# Patient Record
Sex: Male | Born: 1970 | Race: Black or African American | Hispanic: No | Marital: Single | Smoking: Never smoker
Health system: Southern US, Community
[De-identification: ages and names within clinical notes are randomized; demographics above are authoritative.]

## PROBLEM LIST (undated history)

## (undated) DIAGNOSIS — R51 Headache: Secondary | ICD-10-CM

## (undated) DIAGNOSIS — F329 Major depressive disorder, single episode, unspecified: Secondary | ICD-10-CM

## (undated) DIAGNOSIS — F32A Depression, unspecified: Secondary | ICD-10-CM

## (undated) DIAGNOSIS — R519 Headache, unspecified: Secondary | ICD-10-CM

## (undated) DIAGNOSIS — B009 Herpesviral infection, unspecified: Secondary | ICD-10-CM

## (undated) DIAGNOSIS — F419 Anxiety disorder, unspecified: Secondary | ICD-10-CM

## (undated) DIAGNOSIS — E119 Type 2 diabetes mellitus without complications: Secondary | ICD-10-CM

## (undated) HISTORY — PX: NO PAST SURGERIES: SHX2092

## (undated) HISTORY — DX: Headache, unspecified: R51.9

## (undated) HISTORY — DX: Headache: R51

## (undated) HISTORY — DX: Type 2 diabetes mellitus without complications: E11.9

## (undated) HISTORY — DX: Herpesviral infection, unspecified: B00.9

## (undated) HISTORY — DX: Anxiety disorder, unspecified: F41.9

---

## 2003-03-01 ENCOUNTER — Ambulatory Visit (HOSPITAL_BASED_OUTPATIENT_CLINIC_OR_DEPARTMENT_OTHER): Admission: RE | Admit: 2003-03-01 | Discharge: 2003-03-01 | Payer: Self-pay | Admitting: Plastic Surgery

## 2003-03-01 ENCOUNTER — Ambulatory Visit (HOSPITAL_COMMUNITY): Admission: RE | Admit: 2003-03-01 | Discharge: 2003-03-01 | Payer: Self-pay | Admitting: Plastic Surgery

## 2010-09-19 ENCOUNTER — Inpatient Hospital Stay (INDEPENDENT_AMBULATORY_CARE_PROVIDER_SITE_OTHER)
Admission: RE | Admit: 2010-09-19 | Discharge: 2010-09-19 | Disposition: A | Discharge status: Home / Self Care | Payer: 59 | Source: Ambulatory Visit | Attending: Emergency Medicine | Admitting: Emergency Medicine

## 2010-09-19 DIAGNOSIS — L600 Ingrowing nail: Secondary | ICD-10-CM

## 2010-09-19 DIAGNOSIS — L98499 Non-pressure chronic ulcer of skin of other sites with unspecified severity: Secondary | ICD-10-CM

## 2010-09-19 DIAGNOSIS — M79609 Pain in unspecified limb: Secondary | ICD-10-CM

## 2012-08-05 ENCOUNTER — Telehealth: Payer: Self-pay

## 2012-08-05 NOTE — Telephone Encounter (Signed)
Opened in error

## 2013-03-12 ENCOUNTER — Emergency Department (INDEPENDENT_AMBULATORY_CARE_PROVIDER_SITE_OTHER)
Admission: EM | Admit: 2013-03-12 | Discharge: 2013-03-12 | Disposition: A | Discharge status: Home / Self Care | Payer: 59 | Source: Home / Self Care | Attending: Family Medicine | Admitting: Family Medicine

## 2013-03-12 ENCOUNTER — Encounter (HOSPITAL_COMMUNITY): Payer: Self-pay | Admitting: Emergency Medicine

## 2013-03-12 DIAGNOSIS — H6690 Otitis media, unspecified, unspecified ear: Secondary | ICD-10-CM

## 2013-03-12 DIAGNOSIS — H6691 Otitis media, unspecified, right ear: Secondary | ICD-10-CM

## 2013-03-12 HISTORY — DX: Depression, unspecified: F32.A

## 2013-03-12 HISTORY — DX: Major depressive disorder, single episode, unspecified: F32.9

## 2013-03-12 MED ORDER — CIPROFLOXACIN-DEXAMETHASONE 0.3-0.1 % OT SUSP: 4.0000 [drp] | Freq: Two times a day (BID) | OTIC | Status: DC

## 2013-03-12 MED ORDER — AMOXICILLIN-POT CLAVULANATE 875-125 MG PO TABS: 1.0000 | ORAL_TABLET | Freq: Two times a day (BID) | ORAL | Status: DC

## 2013-03-12 NOTE — Discharge Instructions (Signed)
Thank you for coming in today. Use the eardrops. Take Augmentin twice daily. Take up to 2 Aleve twice daily for pain. Call or go to the emergency room if you get worse, have trouble breathing, have chest pains, or palpitations.  If not getting better [facility].

## 2013-03-12 NOTE — ED Notes (Signed)
Assessment per Dr. Corey. 

## 2013-03-12 NOTE — ED Provider Notes (Signed)
Justin BassetCedric Hafner is a 43 y.o. male who presents to Urgent Care today for ear pain. Patient has had several days of sore throat cough congestion but today developed severe right ear pain. Patient has decreased hearing as well. No nausea vomiting diarrhea fevers or chills. No medications tried. Patient feels well otherwise.   Past Medical History  Diagnosis Date  . Depression    History  Substance Use Topics  . Smoking status: Never Smoker   . Smokeless tobacco: Not on file  . Alcohol Use: Yes   ROS as above Medications reviewed. No current facility-administered medications for this encounter.   Current Outpatient Prescriptions  Medication Sig Dispense Refill  . BuPROPion HCl (WELLBUTRIN PO) Take by mouth.      Marland Kitchen. FLUoxetine HCl (PROZAC PO) Take by mouth.      . Multiple Vitamins-Minerals (MULTIVITAMIN PO) Take by mouth.      Marland Kitchen. amoxicillin-clavulanate (AUGMENTIN) 875-125 MG per tablet Take 1 tablet by mouth every 12 (twelve) hours.  14 tablet  0  . ciprofloxacin-dexamethasone (CIPRODEX) otic suspension Place 4 drops into the right ear 2 (two) times daily.  7.5 mL  0    Exam:  BP 150/86  Pulse 82  Temp(Src) 99.1 F (37.3 C) (Oral)  Resp 20  SpO2 100% Gen: Well NAD HEENT: EOMI,  MMM left panic membrane is normal. Right tympanic membrane is cloudy yellow bulging outwards with erythematous streaks. Nontender mastoids bilaterally. History pharynx is mildly erythematous. Lungs: Normal work of breathing. CTABL Heart: RRR no MRG Exts: warm and well perfused.   Assessment and Plan: 43 y.o. male with otitis media and externa. Plan to treat with Ciprodex eardrops, and Augmentin. Continue ibuprofen Tylenol or Aleve. Followup with primary care provider  Discussed warning signs or symptoms. Please see discharge instructions. Patient expresses understanding.    Justin BongEvan S Kamorie Aldous, MD 03/12/13 [phone]

## 2013-12-28 ENCOUNTER — Encounter: Payer: Self-pay | Admitting: *Deleted

## 2013-12-29 ENCOUNTER — Encounter: Payer: Self-pay | Admitting: Neurology

## 2013-12-29 ENCOUNTER — Ambulatory Visit (INDEPENDENT_AMBULATORY_CARE_PROVIDER_SITE_OTHER): Payer: 59 | Admitting: Neurology

## 2013-12-29 VITALS — BP 110/72 | HR 60 | Ht 70.0 in | Wt 235.0 lb

## 2013-12-29 DIAGNOSIS — G4486 Cervicogenic headache: Secondary | ICD-10-CM

## 2013-12-29 DIAGNOSIS — R51 Headache: Secondary | ICD-10-CM

## 2013-12-29 DIAGNOSIS — G4440 Drug-induced headache, not elsewhere classified, not intractable: Secondary | ICD-10-CM

## 2013-12-29 DIAGNOSIS — G4441 Drug-induced headache, not elsewhere classified, intractable: Secondary | ICD-10-CM

## 2013-12-29 DIAGNOSIS — E0810 Diabetes mellitus due to underlying condition with ketoacidosis without coma: Secondary | ICD-10-CM

## 2013-12-29 DIAGNOSIS — G44319 Acute post-traumatic headache, not intractable: Secondary | ICD-10-CM

## 2013-12-29 MED ORDER — TOPIRAMATE 25 MG PO TABS: 25.0000 mg | ORAL_TABLET | Freq: Every day | ORAL | Status: DC

## 2013-12-29 MED ORDER — PREDNISONE 10 MG PO TABS: ORAL_TABLET | ORAL | Status: DC

## 2013-12-29 NOTE — Patient Instructions (Addendum)
I think the headaches are due to the neck and also due to medication overuse. 1.  We will start topamax 25mg  at bedtime to reduce frequency of headaches.  Possible side effects include: impaired thinking, sedation, paresthesias (numbness and tingling) and weight loss.  It may cause dehydration and there is a small risk for kidney stones, so make sure to stay hydrated with water during the day.  There is also a very small risk for glaucoma, so if you notice any change in your vision while taking this medication, see an ophthalmologist.   2.  Start a prednisone taper to try and break the daily headaches.  Take 6tabs x1day, then 5tabs x1day, then 4tabs x1day, then 3tabs x1day, then 2tabs x1day, then 1tab x1day, then STOP.  Do not take Aleve or ibuprofen while on this. 3.  After finishing the prednisone, limit use of all pain relievers (tylenol, Aleve, ibuprofen) to no more than 2 days out of the week.   4.  To be sure, we will get MRI of the brain with and without contrast to look for other causes of daily headache. [id number]   [facility]  01/02/14 8:15am  5.  Call in 4 weeks with update and we can adjust dose of topamax.  Follow up in 3 months. 6. May continue Robaxin at bedtime
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
## 2014-01-02 ENCOUNTER — Telehealth: Payer: Self-pay | Admitting: *Deleted

## 2014-01-02 ENCOUNTER — Ambulatory Visit
Admission: RE | Admit: 2014-01-02 | Discharge: 2014-01-02 | Disposition: A | Discharge status: Home / Self Care | Payer: 59 | Source: Ambulatory Visit | Attending: Neurology | Admitting: Neurology

## 2014-01-02 DIAGNOSIS — R51 Headache: Secondary | ICD-10-CM

## 2014-01-02 DIAGNOSIS — G44319 Acute post-traumatic headache, not intractable: Secondary | ICD-10-CM

## 2014-01-02 DIAGNOSIS — G4440 Drug-induced headache, not elsewhere classified, not intractable: Secondary | ICD-10-CM

## 2014-01-02 DIAGNOSIS — G4486 Cervicogenic headache: Secondary | ICD-10-CM

## 2014-01-02 MED ORDER — GADOBENATE DIMEGLUMINE 529 MG/ML IV SOLN
20.0000 mL | Freq: Once | INTRAVENOUS | Status: AC | PRN
  Administered 2014-01-02: 20 mL via INTRAVENOUS

## 2014-01-02 NOTE — Telephone Encounter (Signed)
Message copied by Fredirick MaudlinVAN DER GLAS, Khaleah Duer E on Tue Jan 02, 2014  1:51 PM ------      Message from: JAFFE, ADAM R      Created: Tue Jan 02, 2014 11:57 AM       Brain mri is unremarkable.      ----- Message -----         From: Rad Results In Interface         Sent: 01/02/2014  10:11 AM           To: Cira ServantAdam Robert Jaffe, DO                   ------

## 2014-01-02 NOTE — Telephone Encounter (Signed)
Left message normal MRI of Brain

## 2014-02-14 ENCOUNTER — Emergency Department (HOSPITAL_COMMUNITY)
Admission: EM | Admit: 2014-02-14 | Discharge: 2014-02-14 | Disposition: A | Discharge status: Home / Self Care | Payer: 59 | Attending: Emergency Medicine | Admitting: Emergency Medicine

## 2014-02-14 ENCOUNTER — Encounter (HOSPITAL_COMMUNITY): Payer: Self-pay | Admitting: Emergency Medicine

## 2014-02-14 ENCOUNTER — Emergency Department (HOSPITAL_COMMUNITY): Payer: 59

## 2014-02-14 DIAGNOSIS — N451 Epididymitis: Secondary | ICD-10-CM | POA: Diagnosis not present

## 2014-02-14 DIAGNOSIS — E119 Type 2 diabetes mellitus without complications: Secondary | ICD-10-CM | POA: Insufficient documentation

## 2014-02-14 DIAGNOSIS — F419 Anxiety disorder, unspecified: Secondary | ICD-10-CM | POA: Diagnosis not present

## 2014-02-14 DIAGNOSIS — N508 Other specified disorders of male genital organs: Secondary | ICD-10-CM | POA: Diagnosis present

## 2014-02-14 DIAGNOSIS — Z79899 Other long term (current) drug therapy: Secondary | ICD-10-CM | POA: Diagnosis not present

## 2014-02-14 DIAGNOSIS — F329 Major depressive disorder, single episode, unspecified: Secondary | ICD-10-CM | POA: Insufficient documentation

## 2014-02-14 DIAGNOSIS — Z8619 Personal history of other infectious and parasitic diseases: Secondary | ICD-10-CM | POA: Insufficient documentation

## 2014-02-14 DIAGNOSIS — R52 Pain, unspecified: Secondary | ICD-10-CM

## 2014-02-14 MED ORDER — TRAMADOL HCL 50 MG PO TABS: 50.0000 mg | ORAL_TABLET | Freq: Four times a day (QID) | ORAL | Status: DC | PRN

## 2014-02-14 MED ORDER — CIPROFLOXACIN HCL 500 MG PO TABS
500.0000 mg | ORAL_TABLET | Freq: Once | ORAL | Status: AC
  Administered 2014-02-14: 500 mg via ORAL
  Filled 2014-02-14: qty 1

## 2014-02-14 MED ORDER — CIPROFLOXACIN HCL 500 MG PO TABS: 500.0000 mg | ORAL_TABLET | Freq: Two times a day (BID) | ORAL | Status: DC

## 2014-02-14 NOTE — ED Provider Notes (Signed)
CSN: [id number]     Arrival date & time 02/14/14  1805 History   First MD Initiated Contact with Patient 02/14/14 1816     Chief Complaint  Patient presents with  . Testicle Pain     (Consider location/radiation/quality/duration/timing/severity/associated sxs/prior Treatment) HPI  The patient has had testicular pain for several days. He reports the pain was worse yesterday. It is the left testicle. The patient was seen by his family doctor today and sent to the emergency department for ultrasound. The patient reports a testicle seemed a little swollen yesterday but not really today. He has not had pain or burning with urination. He reports a little bit of inguinal pain with it. He reports he might of had a similar pain some years back but he doesn't recall what it was at the time.  Past Medical History  Diagnosis Date  . Depression   . Headache   . Anxiety   . Diabetes mellitus without complication   . Herpes simplex    Past Surgical History  Procedure Laterality Date  . No past surgeries     Family History  Problem Relation Age of Onset  . Hypertension Father   . Hypertension Mother   . Diabetes Mother   . Heart disease Mother    History  Substance Use Topics  . Smoking status: Never Smoker   . Smokeless tobacco: Not on file  . Alcohol Use: Yes     Comment: 1-2 drinks a week    Review of Systems 10 Systems reviewed and are negative for acute change except as noted in the HPI.    Allergies  Bee venom  Home Medications   Prior to Admission medications   Medication Sig Start Date End Date Taking? Authorizing Provider  ALPRAZolam Prudy Feeler(XANAX) 0.5 MG tablet Take 0.5 mg by mouth 2 (two) times daily as needed for anxiety.   Yes Historical Provider, MD  buPROPion (WELLBUTRIN XL) 300 MG 24 hr tablet Take 300 mg by mouth daily.   Yes Historical Provider, MD  FLUoxetine (PROZAC) 20 MG capsule Take 20 mg by mouth daily.   Yes Historical Provider, MD  metFORMIN (GLUCOPHAGE) 1000  MG tablet Take 1,000 mg by mouth 2 (two) times daily with a meal.   Yes Historical Provider, MD  methocarbamol (ROBAXIN) 750 MG tablet Take 750 mg by mouth at bedtime as needed for muscle spasms.    Yes Historical Provider, MD  Multiple Vitamins-Minerals (MULTIVITAMIN PO) Take by mouth.   Yes Historical Provider, MD  valACYclovir (VALTREX) 1000 MG tablet Take 1,000 mg by mouth daily.    Yes Historical Provider, MD  ciprofloxacin (CIPRO) 500 MG tablet Take 1 tablet (500 mg total) by mouth 2 (two) times daily. 02/14/14   Arby BarretteMarcy Markevion Lattin, MD  predniSONE (DELTASONE) 10 MG tablet Take 6tabs x1day, then 5tabs x1day, then 4tabs x1day, then 3tabs x1day, then 2tabs x1day, then 1tab x1day, then STOP Patient not taking: Reported on 02/14/2014 12/29/13   Cira ServantAdam Robert Jaffe, DO  topiramate (TOPAMAX) 25 MG tablet Take 1 tablet (25 mg total) by mouth at bedtime. Patient not taking: Reported on 02/14/2014 12/29/13   Cira ServantAdam Robert Jaffe, DO  traMADol (ULTRAM) 50 MG tablet Take 1 tablet (50 mg total) by mouth every 6 (six) hours as needed. 02/14/14   Arby BarretteMarcy Natacha Jepsen, MD   BP 138/88 mmHg  Pulse 84  Temp(Src) 98 F (36.7 C) (Oral)  Resp 18  SpO2 98% Physical Exam  Constitutional: He is oriented to person, place, and time.  He appears well-developed and well-nourished. No distress.  HENT:  Head: Normocephalic and atraumatic.  Eyes: EOM are normal.  Pulmonary/Chest: Effort normal.  Abdominal: Soft. He exhibits no distension and no mass. There is no tenderness. There is no rebound and no guarding.  Genitourinary: Penis normal.  Testicular examination is normal. There is no scrotal swelling or edema. No masses in the inguinal canal. Minimal reproduced with tenderness to palpation of the left testicle. Only slight tenderness with palpation of the epididymis.  Musculoskeletal: Normal range of motion. He exhibits no edema or tenderness.  The patient's family to her about the room without any limitations.  Neurological: He is  alert and oriented to person, place, and time.  Skin: Skin is warm and dry. He is not diaphoretic.  Psychiatric: He has a normal mood and affect.    ED Course  Procedures (including critical care time) Labs Review Labs Reviewed - No data to display  Imaging Review [place] Scrotum  02/14/2014   CLINICAL DATA:  Initial encounter for two-day history of left testicular pain.  EXAM: SCROTAL ULTRASOUND  DOPPLER ULTRASOUND OF THE TESTICLES  TECHNIQUE: Complete ultrasound examination of the testicles, epididymis, and other scrotal structures was performed. Color and spectral Doppler ultrasound were also utilized to evaluate blood flow to the testicles.  COMPARISON:  None.  FINDINGS: Right testicle  Measurements: 4.3 x 2.3 x 3.1 cm. No mass or microlithiasis visualized.  Left testicle  Measurements: 4.5 x 2.0 x 3.1 cm. No mass or microlithiasis visualized.  Right epididymis:  Tiny 5 mm epididymal cyst or spermatocele.  Left epididymis:  Normal in size and appearance.  Hydrocele:  None visualized.  Varicocele:  None visualized.  Pulsed Doppler interrogation of both testes demonstrates low resistance arterial and venous waveforms bilaterally.  IMPRESSION: Tiny 5 mm right epididymal cyst or spermatocele. Otherwise normal testicular ultrasound. Specifically, no evidence for testicular torsion.   Electronically Signed   By: Kennith CenterEric  Mansell M.D.   On: 02/14/2014 19:19   [place] Art/ven Flow Abd Pelv Doppler  02/14/2014   CLINICAL DATA:  Initial encounter for two-day history of left testicular pain.  EXAM: SCROTAL ULTRASOUND  DOPPLER ULTRASOUND OF THE TESTICLES  TECHNIQUE: Complete ultrasound examination of the testicles, epididymis, and other scrotal structures was performed. Color and spectral Doppler ultrasound were also utilized to evaluate blood flow to the testicles.  COMPARISON:  None.  FINDINGS: Right testicle  Measurements: 4.3 x 2.3 x 3.1 cm. No mass or microlithiasis visualized.  Left testicle  Measurements: 4.5 x 2.0  x 3.1 cm. No mass or microlithiasis visualized.  Right epididymis:  Tiny 5 mm epididymal cyst or spermatocele.  Left epididymis:  Normal in size and appearance.  Hydrocele:  None visualized.  Varicocele:  None visualized.  Pulsed Doppler interrogation of both testes demonstrates low resistance arterial and venous waveforms bilaterally.  IMPRESSION: Tiny 5 mm right epididymal cyst or spermatocele. Otherwise normal testicular ultrasound. Specifically, no evidence for testicular torsion.   Electronically Signed   By: Kennith CenterEric  Mansell M.D.   On: 02/14/2014 19:19     EKG Interpretation None      MDM   Final diagnoses:  Epididymitis, left   ULTRASOUND HAS RULED OUT TORSION OR SIGNIFICANT PATHOLOGY. ON PALPATION THE PATIENT DOES HAVE TENDER EPIDIDYMIS. At this time based on his history and pain he will be treated appear clear for epididymitis. He is otherwise well in appearance without severe pain at this time. Pain is apparently improving relative to yesterday.  Arby Barrette, MD 02/14/14 2023

## 2014-02-14 NOTE — Discharge Instructions (Signed)
Epididymitis °Epididymitis is a swelling (inflammation) of the epididymis. The epididymis is a cord-like structure along the back part of the testicle. Epididymitis is usually, but not always, caused by infection. This is usually a sudden problem beginning with chills, fever and pain behind the scrotum and in the testicle. There may be swelling and redness of the testicle. °DIAGNOSIS  °Physical examination will reveal a tender, swollen epididymis. Sometimes, cultures are obtained from the urine or from prostate secretions to help find out if there is an infection or if the cause is a different problem. Sometimes, blood work is performed to see if your white blood cell count is elevated and if a germ (bacterial) or viral infection is present. Using this knowledge, an appropriate medicine which kills germs (antibiotic) can be chosen by your caregiver. A viral infection causing epididymitis will most often go away (resolve) without treatment. °HOME CARE INSTRUCTIONS  °· Hot sitz baths for 20 minutes, 4 times per day, may help relieve pain. °· Only take over-the-counter or prescription medicines for pain, discomfort or fever as directed by your caregiver. °· Take all medicines, including antibiotics, as directed. Take the antibiotics for the full prescribed length of time even if you are feeling better. °· It is very important to keep all follow-up appointments. °SEEK IMMEDIATE MEDICAL CARE IF:  °· You have a fever. °· You have pain not relieved with medicines. °· You have any worsening of your problems. °· Your pain seems to come and go. °· You develop pain, redness, and swelling in the scrotum and surrounding areas. °MAKE SURE YOU:  °· Understand these instructions. °· Will watch your condition. °· Will get help right away if you are not doing well or get worse. °Document Released: 02/21/2000 Document Revised: 05/18/2011 Document Reviewed: 01/10/2009 °[employer]® Patient Information ©2015 [employer]. This information  is not intended to replace advice given to you by your health care provider. Make sure you discuss any questions you have with your health care provider. ° °

## 2014-02-14 NOTE — ED Notes (Signed)
US at bedside

## 2014-02-14 NOTE — ED Notes (Signed)
Pt being sent from [place] Kater[facility] [place] [facility] for [place] and rule out testicular torsion

## 2014-04-03 ENCOUNTER — Encounter: Payer: Self-pay | Admitting: Neurology

## 2014-04-03 ENCOUNTER — Ambulatory Visit (INDEPENDENT_AMBULATORY_CARE_PROVIDER_SITE_OTHER): Payer: 59 | Admitting: Neurology

## 2014-04-03 VITALS — BP 134/78 | HR 70 | Temp 98.8°F | Resp 16 | Ht 70.0 in | Wt 228.5 lb

## 2014-04-03 DIAGNOSIS — G44219 Episodic tension-type headache, not intractable: Secondary | ICD-10-CM
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
## 2014-04-03 NOTE — Patient Instructions (Signed)
1.  It's okay to take Advil or Aleve for the headache, but no more than 2 days out of the week 2.  Continue neck exercises 3.  We will refer you for cognitive behavioral therapy to help deal with stress.  This way, we can try to prevent headaches from starting. 4.  Follow up in 3 months.

## 2014-06-08 ENCOUNTER — Ambulatory Visit: Payer: 59 | Admitting: Neurology

## 2014-06-08 DIAGNOSIS — Z029 Encounter for administrative examinations, unspecified: Secondary | ICD-10-CM

## 2014-06-11 ENCOUNTER — Encounter: Payer: Self-pay | Admitting: Neurology

## 2016-07-28 IMAGING — US US SCROTUM
1 series · 14 of 25 positions shown · non-contrast
Comparison: None.

CLINICAL DATA: Initial encounter for two-day history of left
testicular pain.

EXAM:
SCROTAL ULTRASOUND
DOPPLER ULTRASOUND OF THE TESTICLES
TECHNIQUE: Complete ultrasound examination of the testicles, epididymis, and
other scrotal structures was performed. Color and spectral Doppler
ultrasound were also utilized to evaluate blood flow to the
testicles.

[Series 1: us scrotum · 0.07mm/px · 14 of 34 slices shown]
[im 1/34]
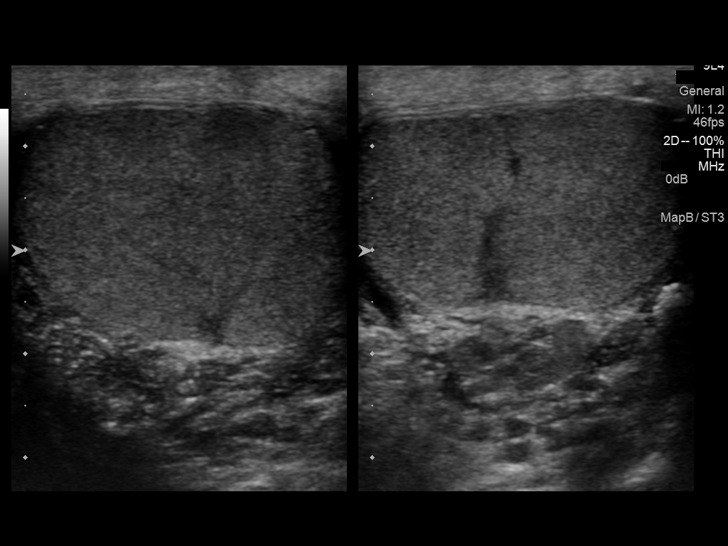
[im 3/34]
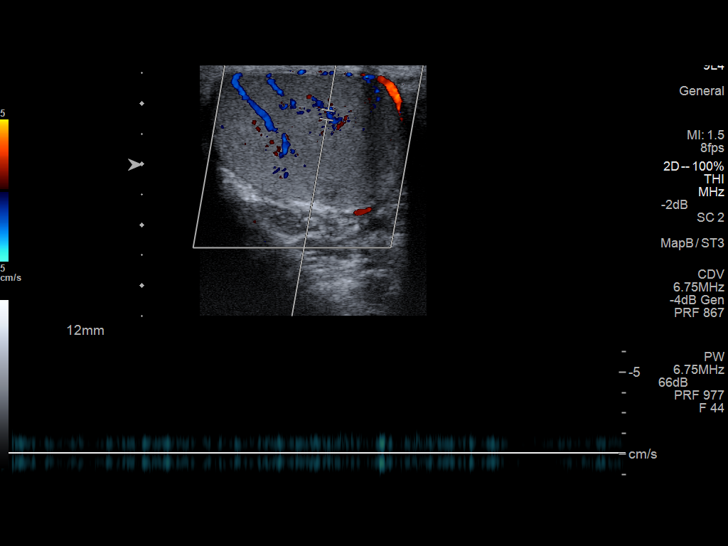
[im 6/34]
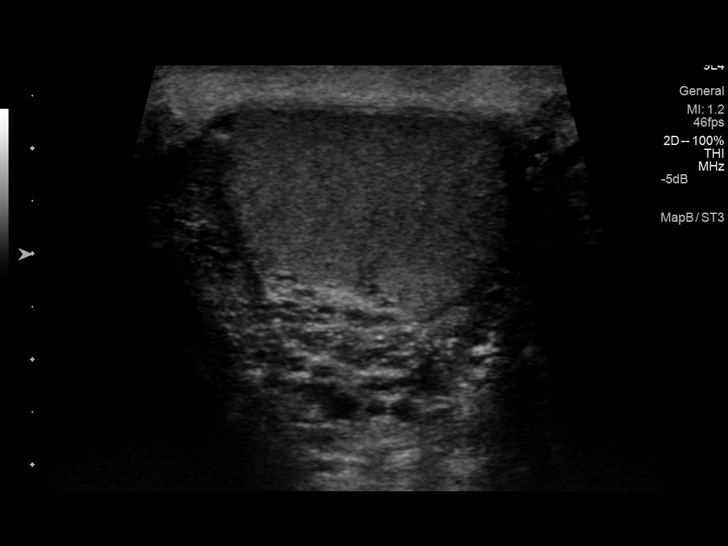
[im 9/34]
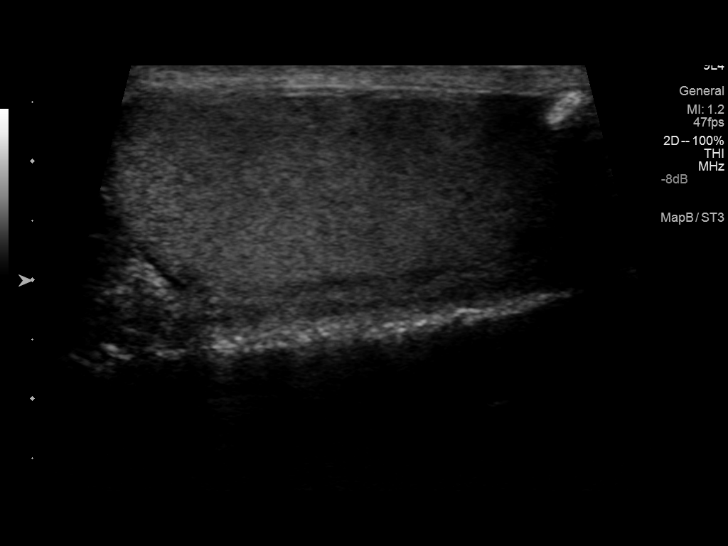
[im 12/34]
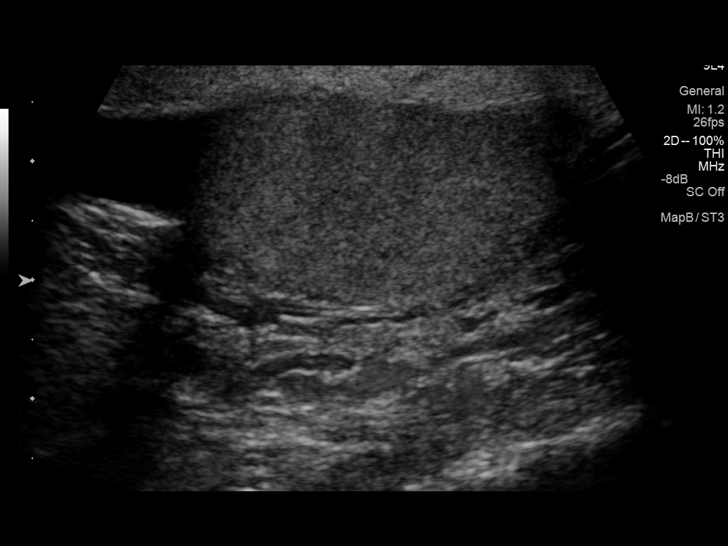
[im 13/34]
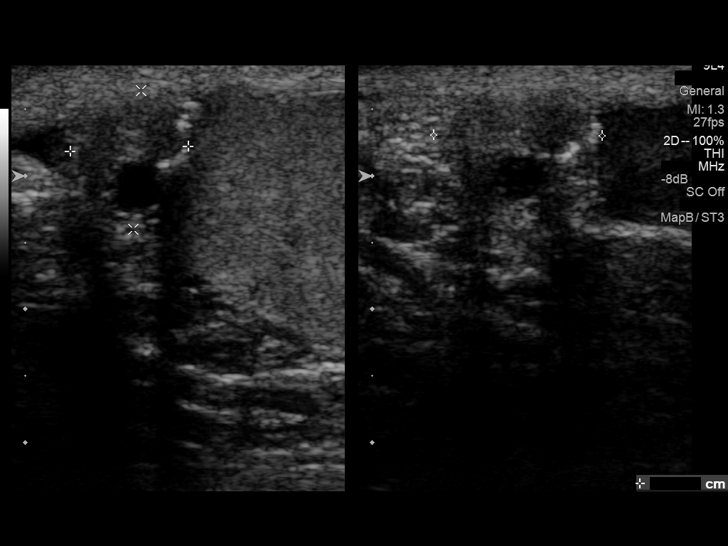
[im 16/34]
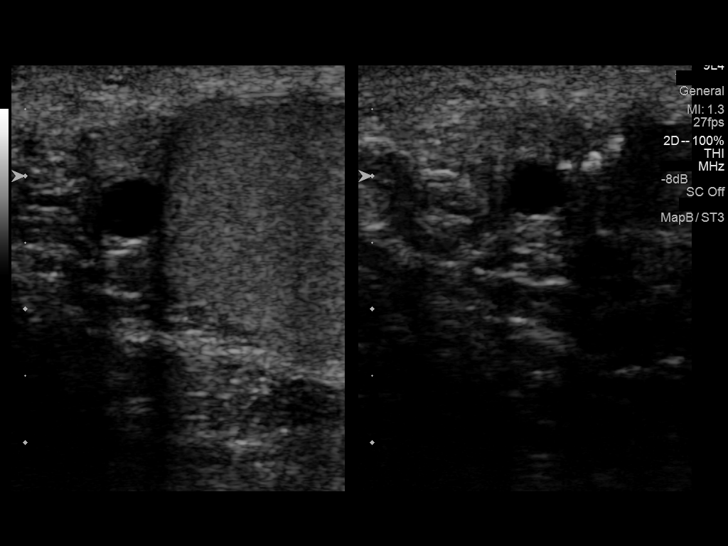
[im 18/34]
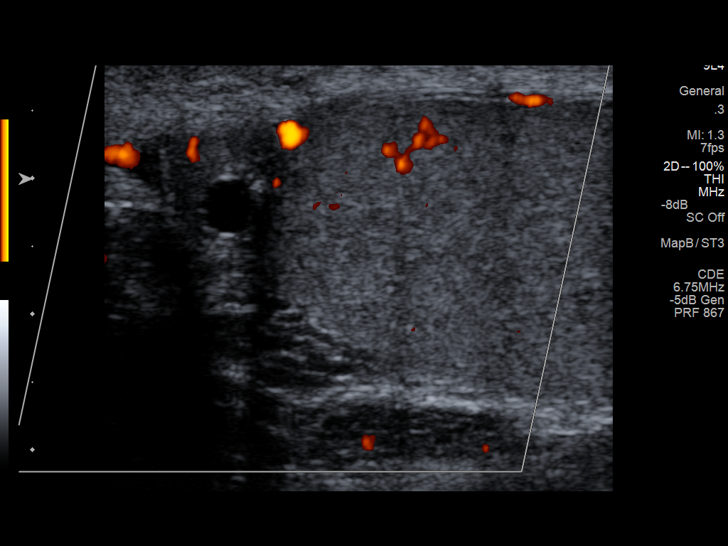
[im 21/34]
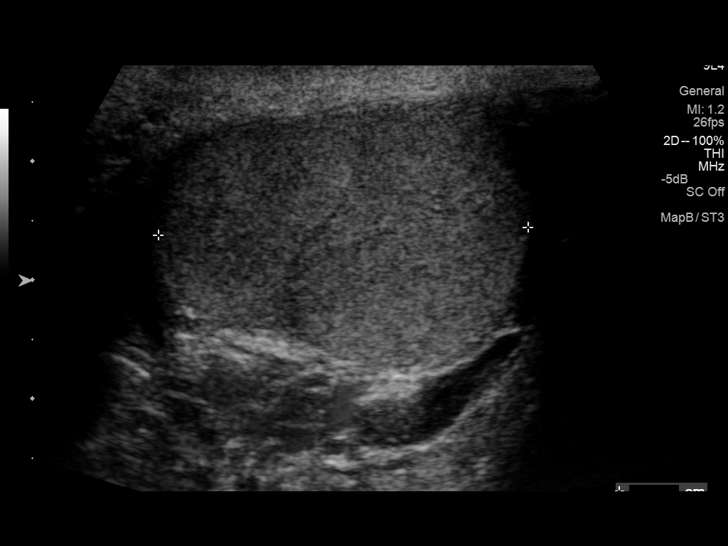
[im 23/34]
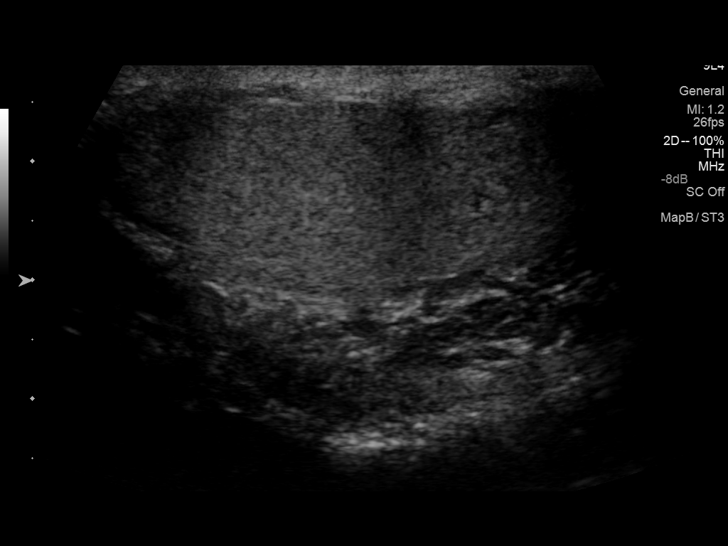
[im 25/34]
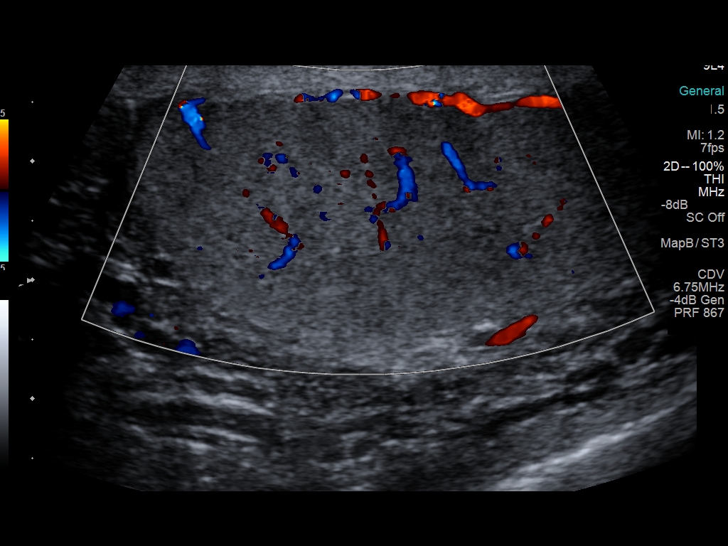
[im 28/34]
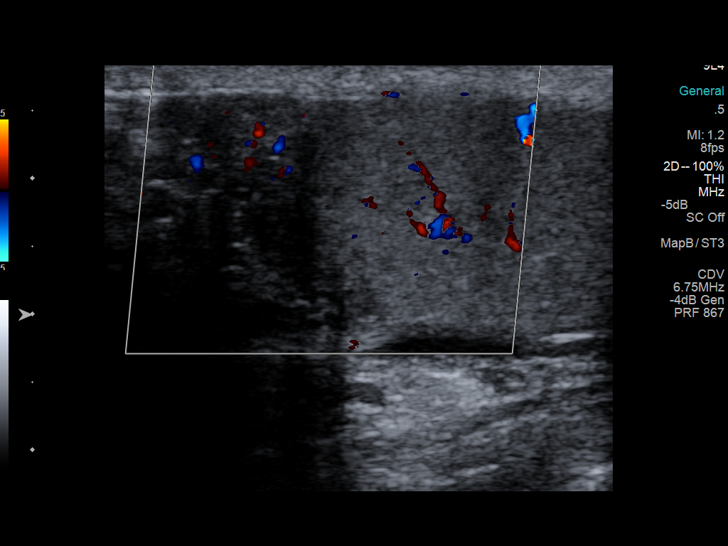
[im 31/34]
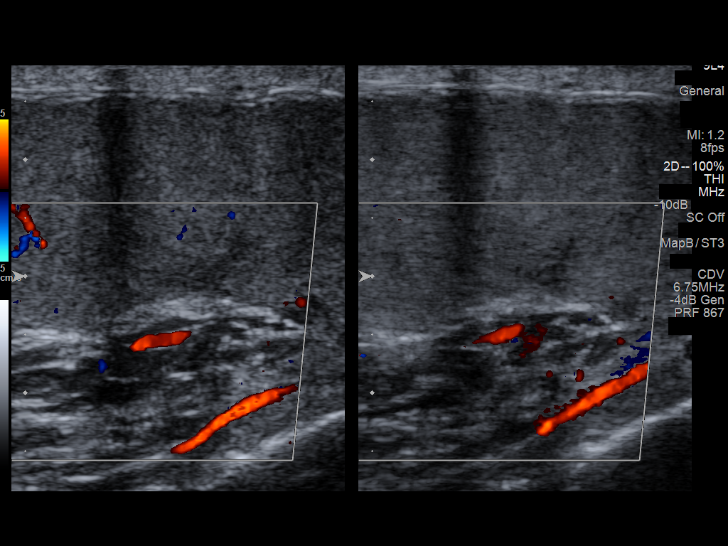
[im 34/34]
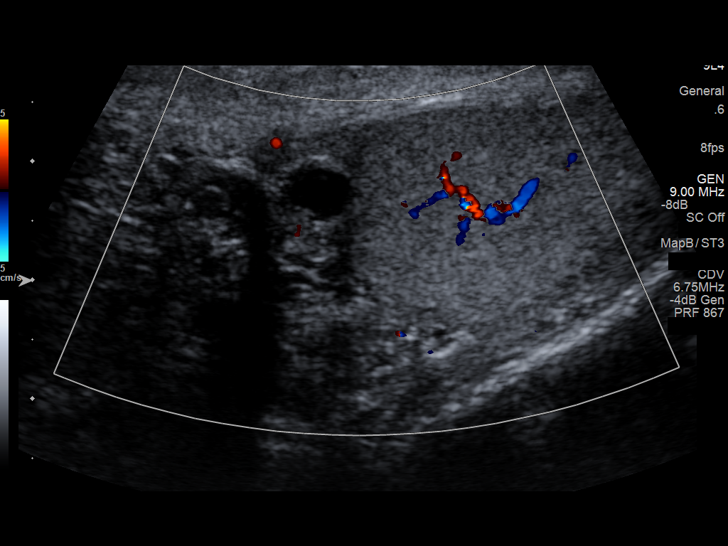

[14 of 25 positions shown; findings below may reference images not displayed]

FINDINGS: Right testicle

Measurements: 4.3 x 2.3 x 3.1 cm. No mass or microlithiasis
visualized.

Left testicle

Measurements: 4.5 x 2.0 x 3.1 cm. No mass or microlithiasis
visualized.

Right epididymis:  Tiny 5 mm epididymal cyst or spermatocele.

Left epididymis:  Normal in size and appearance.

Hydrocele:  None visualized.

Varicocele:  None visualized.

Pulsed Doppler interrogation of both testes demonstrates low
resistance arterial and venous waveforms bilaterally.
IMPRESSION: Tiny 5 mm right epididymal cyst or spermatocele. Otherwise normal
testicular ultrasound. Specifically, no evidence for testicular
torsion.

## 2017-08-26 ENCOUNTER — Encounter: Payer: Self-pay | Admitting: Cardiology

## 2017-08-27 ENCOUNTER — Ambulatory Visit: Payer: 59 | Admitting: Cardiology

## 2017-08-30 ENCOUNTER — Ambulatory Visit (INDEPENDENT_AMBULATORY_CARE_PROVIDER_SITE_OTHER): Payer: 59 | Admitting: Cardiology

## 2017-08-30 ENCOUNTER — Encounter: Payer: Self-pay | Admitting: Cardiology

## 2017-08-30 VITALS — BP 128/76 | HR 82 | Ht 70.0 in | Wt 224.0 lb

## 2017-08-30 DIAGNOSIS — E088 Diabetes mellitus due to underlying condition with unspecified complications: Secondary | ICD-10-CM

## 2017-08-30 DIAGNOSIS — R079 Chest pain, unspecified: Secondary | ICD-10-CM | POA: Diagnosis not present

## 2017-08-30 MED ORDER — ASPIRIN EC 81 MG PO TBEC: 81.0000 mg | DELAYED_RELEASE_TABLET | Freq: Every day | ORAL | 3 refills | Status: AC

## 2017-08-30 NOTE — Progress Notes (Signed)
Cardiology Office Note:    Date:  08/30/2017   ID:  Justin Cantu, DOB 1970/03/27, MRN [id number]  PCP:  Farris Has, MD  Cardiologist:  Garwin Brothers, MD   Referring MD: Farris Has, MD    ASSESSMENT:    1. Chest pain, unspecified type   2. Diabetes mellitus due to underlying condition with complication, without long-term current use of insulin (HCC)    PLAN:    In order of problems listed above:  1. Primary prevention stressed with the patient.  Importance of compliance with diet and medication stressed and he vocalized understanding. 2. His blood pressure is stable.  Diet was discussed for diabetes mellitus and the fact that he is overweight and weight reduction was stressed. 3. In view of his diagnosis of diabetes I asked him to take a coated aspirin on a daily basis after checking with his doctor.  I also told him to discuss with his primary care physician about ACE inhibitor or ARB and statin therapy in view of diabetes mellitus and he will do this and understand benefits and risks of these from him.  I also discussed this with the patient at length. 4. In view of his symptoms of schedule an exercise stress echo.  His symptoms are atypical for coronary etiology and I reassured him. 5. Patient will be seen in follow-up appointment in 4 months or earlier if the patient has any concerns    Medication Adjustments/Labs and Tests Ordered: Current medicines are reviewed at length with the patient today.  Concerns regarding medicines are outlined above.  Orders Placed This Encounter  Procedures  . EKG 12-Lead  . ECHOCARDIOGRAM STRESS TEST   No orders of the defined types were placed in this encounter.    History of Present Illness:    Justin Cantu is a 47 y.o. male who is being seen today for the evaluation of chest pain at the request of Farris Has, MD.  Patient has past medical history of diabetes mellitus type 2.  Over the past several weeks he complains of left arm  pain.  No orthopnea or PND.  He has some chest tightness unrelated to the symptoms.  He does push-ups on and off and these does not bring around the symptoms.  He does not do any aerobic exercise.  Sexual activity do not bring about the symptoms.  He tells me that his family history of coronary artery disease and therefore he is concerned and was sent here for evaluation.  At the time of my evaluation, the patient is alert awake oriented and in no distress.  Past Medical History:  Diagnosis Date  . Anxiety   . Depression   . Diabetes mellitus without complication (HCC)   . Headache   . Herpes simplex     Past Surgical History:  Procedure Laterality Date  . NO PAST SURGERIES      Current Medications: Current Meds  Medication Sig  . ALPRAZolam (XANAX) 0.5 MG tablet Take 0.5 mg by mouth 2 (two) times daily as needed for anxiety.  Marland Kitchen buPROPion (WELLBUTRIN XL) 300 MG 24 hr tablet Take 300 mg by mouth daily.  Marland Kitchen EPINEPHrine 0.3 mg/0.3 mL IJ SOAJ injection Inject 0.3 mg into the muscle See admin instructions.   Marland Kitchen FARXIGA 5 MG TABS tablet Take 1 tablet by mouth daily.  Marland Kitchen FLUoxetine (PROZAC) 20 MG capsule Take 20 mg by mouth daily.  Marland Kitchen glipiZIDE (GLUCOTROL XL) 10 MG 24 hr tablet Take 10 mg by mouth  daily.  . metFORMIN (GLUCOPHAGE) 1000 MG tablet Take 1,000 mg by mouth 2 (two) times daily with a meal.  . Multiple Vitamins-Minerals (MULTIVITAMIN PO) Take 1 tablet by mouth daily.   . valACYclovir (VALTREX) 1000 MG tablet Take 1,000 mg by mouth daily.      Allergies:   Bee venom   Social History   Socioeconomic History  . Marital status: Single    Spouse name: Not on file  . Number of children: Not on file  . Years of education: Not on file  . Highest education level: Not on file  Occupational History  . Not on file  Social Needs  . Financial resource strain: Not on file  . Food insecurity:    Worry: Not on file    Inability: Not on file  . Transportation needs:    Medical: Not on  file    Non-medical: Not on file  Tobacco Use  . Smoking status: Never Smoker  . Smokeless tobacco: Never Used  Substance and Sexual Activity  . Alcohol use: Yes    Alcohol/week: 0.0 oz    Comment: 1-2 drinks a week  . Drug use: No  . Sexual activity: Yes    Partners: Female  Lifestyle  . Physical activity:    Days per week: Not on file    Minutes per session: Not on file  . Stress: Not on file  Relationships  . Social connections:    Talks on phone: Not on file    Gets together: Not on file    Attends religious service: Not on file    Active member of club or organization: Not on file    Attends meetings of clubs or organizations: Not on file    Relationship status: Not on file  Other Topics Concern  . Not on file  Social History Narrative  . Not on file     Family History: The patient's family history includes Diabetes in his mother; Heart disease in his mother; Hypertension in his father and mother.  ROS:   Please see the history of present illness.    All other systems reviewed and are negative.  EKGs/Labs/Other Studies Reviewed:    The following studies were reviewed today: EKG reveals sinus rhythm and nonspecific ST-T changes.  I reviewed blood work from his cell phone extensively.  His lipids especially LDL is excellent.   Recent Labs: No results found for requested labs within last 8760 hours.  Recent Lipid Panel No results found for: CHOL, TRIG, HDL, CHOLHDL, VLDL, LDLCALC, LDLDIRECT  Physical Exam:    VS:  BP 128/76 (BP Location: Right Arm, Patient Position: Sitting, Cuff Size: Normal)   Pulse 82   Ht 5\' 10"  (1.778 m)   Wt 224 lb (101.6 kg)   SpO2 98%   BMI 32.14 kg/m     Wt Readings from Last 3 Encounters:  08/30/17 224 lb (101.6 kg)  04/03/14 228 lb 8 oz (103.6 kg)  12/29/13 235 lb (106.6 kg)     GEN: Patient is in no acute distress HEENT: Normal NECK: No JVD; No carotid bruits LYMPHATICS: No lymphadenopathy CARDIAC: S1 S2 regular, 2/6  systolic murmur at the apex. RESPIRATORY:  Clear to auscultation without rales, wheezing or rhonchi  ABDOMEN: Soft, non-tender, non-distended MUSCULOSKELETAL:  No edema; No deformity  SKIN: Warm and dry NEUROLOGIC:  Alert and oriented x 3 PSYCHIATRIC:  Normal affect    Signed, Garwin Brothersajan R Previn Jian, MD  08/30/2017 9:48 AM    [facility]

## 2017-08-30 NOTE — Patient Instructions (Signed)
Medication Instructions:  Your physician recommends that you continue on your current medications as directed. Please refer to the Current Medication list given to you today.  Aspirin samples given.  Labwork: None  Testing/Procedures: Your physician has requested that you have a stress echocardiogram. For further information please visit [email or web address]. Please follow instruction sheet as given.  Follow-Up: Your physician recommends that you schedule a follow-up appointment in: 3 months  Any Other Special Instructions Will Be Listed Below (If Applicable).     If you need a refill on your cardiac medications before your next appointment, please call your pharmacy.   [facility]  Garey HamAshley A, RN, BSN

## 2017-09-23 ENCOUNTER — Ambulatory Visit (HOSPITAL_BASED_OUTPATIENT_CLINIC_OR_DEPARTMENT_OTHER)
Admission: RE | Admit: 2017-09-23 | Discharge: 2017-09-23 | Disposition: A | Discharge status: Home / Self Care | Payer: 59 | Source: Ambulatory Visit | Attending: Cardiology | Admitting: Cardiology

## 2017-09-23 DIAGNOSIS — E119 Type 2 diabetes mellitus without complications: Secondary | ICD-10-CM | POA: Diagnosis not present

## 2017-09-23 DIAGNOSIS — R079 Chest pain, unspecified: Secondary | ICD-10-CM | POA: Diagnosis not present

## 2017-09-23 NOTE — Progress Notes (Signed)
  Echocardiogram Echocardiogram Stress Test has been performed.  Benedicto Capozzi T Korbyn Vanes 09/23/2017, 10:46 AM

## 2019-06-02 ENCOUNTER — Ambulatory Visit: Payer: BC Managed Care – PPO | Attending: Internal Medicine

## 2019-06-02 DIAGNOSIS — Z23 Encounter for immunization: Secondary | ICD-10-CM

## 2019-06-02 NOTE — Progress Notes (Signed)
   Covid-19 Vaccination Clinic  Name:  Justin Cantu    MRN: [id number] DOB: [birth year]  06/02/2019  Mr. [name] was observed post Covid-19 immunization for 15 minutes without incident. He was provided with Vaccine Information Sheet and instruction to access the V-Safe system.   Mr. [name] was instructed to call 911 with any severe reactions post vaccine: Marland Kitchen Difficulty breathing  . Swelling of face and throat  . A fast heartbeat  . A bad rash all over body  . Dizziness and weakness   Immunizations Administered    Name Date Dose VIS Date Route   Pfizer COVID-19 Vaccine 06/02/2019  9:06 AM 0.3 mL 02/17/2019 Intramuscular   Manufacturer: [employer], [employer]   Lot: [id number]   NDC: [address]-9

## 2019-06-27 ENCOUNTER — Ambulatory Visit: Payer: BC Managed Care – PPO | Attending: Internal Medicine

## 2019-06-27 DIAGNOSIS — Z23 Encounter for immunization: Secondary | ICD-10-CM

## 2019-06-27 NOTE — Progress Notes (Signed)
   Covid-19 Vaccination Clinic  Name:  Justin Cantu    MRN: [id number] DOB: [birth year]  06/27/2019  [name] was observed post Covid-19 immunization for 30 minutes based on pre-vaccination screening without incident. He was provided with Vaccine Information Sheet and instruction to access the V-Safe system.   [name] was instructed to call 911 with any severe reactions post vaccine: Marland Kitchen Difficulty breathing  . Swelling of face and throat  . A fast heartbeat  . A bad rash all over body  . Dizziness and weakness   Immunizations Administered    Name Date Dose VIS Date Route   Pfizer COVID-19 Vaccine 06/27/2019  9:06 AM 0.3 mL 05/03/2018 Intramuscular   Manufacturer: [employer], [employer]   Lot: [id number]   NDC: [address]-4

## 2022-08-29 LAB — HM DIABETES EYE EXAM

## 2022-11-13 ENCOUNTER — Ambulatory Visit (INDEPENDENT_AMBULATORY_CARE_PROVIDER_SITE_OTHER): Payer: No Typology Code available for payment source | Admitting: Family

## 2022-11-13 VITALS — BP 142/92 | HR 73 | Temp 97.3°F | Resp 14 | Ht 70.0 in | Wt 227.0 lb

## 2022-11-13 DIAGNOSIS — E088 Diabetes mellitus due to underlying condition with unspecified complications: Secondary | ICD-10-CM | POA: Diagnosis not present

## 2022-11-13 DIAGNOSIS — Z113 Encounter for screening for infections with a predominantly sexual mode of transmission: Secondary | ICD-10-CM

## 2022-11-13 DIAGNOSIS — Z1159 Encounter for screening for other viral diseases: Secondary | ICD-10-CM | POA: Diagnosis not present

## 2022-11-13 DIAGNOSIS — F411 Generalized anxiety disorder: Secondary | ICD-10-CM

## 2022-11-13 DIAGNOSIS — Z5181 Encounter for therapeutic drug level monitoring: Secondary | ICD-10-CM

## 2022-11-13 DIAGNOSIS — F321 Major depressive disorder, single episode, moderate: Secondary | ICD-10-CM

## 2022-11-13 DIAGNOSIS — Z7689 Persons encountering health services in other specified circumstances: Secondary | ICD-10-CM

## 2022-11-13 DIAGNOSIS — I10 Essential (primary) hypertension: Secondary | ICD-10-CM

## 2022-11-13 MED ORDER — GLIPIZIDE ER 10 MG PO TB24
10.0000 mg | ORAL_TABLET | Freq: Every day | ORAL | 1 refills | Status: DC
Start: 2022-11-13 — End: 2023-06-28

## 2022-11-13 MED ORDER — LOSARTAN POTASSIUM 25 MG PO TABS: 25.0000 mg | ORAL_TABLET | Freq: Every day | ORAL | 1 refills | Status: DC

## 2022-11-13 MED ORDER — BUPROPION HCL ER (XL) 300 MG PO TB24: 300.0000 mg | ORAL_TABLET | Freq: Every day | ORAL | 1 refills | Status: DC

## 2022-11-13 NOTE — Patient Instructions (Signed)

## 2022-11-13 NOTE — Progress Notes (Signed)
Provider: Richarda Blade FNP-C   Nuh Lipton, Donalee Citrin, NP  Patient Care Team: Trish Mancinelli, Donalee Citrin, NP as PCP - General (Family Medicine)  Extended Emergency Contact Information Primary Emergency Contact: Snowball,William Address: [address]          [place], [place] [address] [place] Home Phone: [phone] Relation: Father  Code Status:  Full Code  Goals of care: Advanced Directive information    11/13/2022    9:50 AM  Advanced Directives  Does Patient Have a Medical Advance Directive? No     Chief Complaint  Patient presents with   Establish Care    HPI:  Pt is a 52 y.o. male seen today establish care here at [facility] for medical management of chronic diseases.Has a medical history of type 2 DM ,Generalized anxiety disorder,major depression and Tinnitus.   Type 2 DM - does not check blood sugars at home.states does not like needles. States was seen by ophthalmologist [facility] Dr. Serafina Royals  Generalized anxiety disorder/Depression - states symptoms get worst when her daughter goes back to her mom in [place].  States has paranoia,fears and Obsessions.worries about her daughter being in school with shootings in schools.  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   States does some exercise by lifting weights once or twice per week.   States had Colonoscopy with [facility] Physician.   Drinks alcohol 4-5 per week.  Past Medical History:  Diagnosis Date   Anxiety    Depression    Diabetes mellitus without complication (HCC)    Headache    Herpes simplex    Past Surgical History:  Procedure Laterality Date   NO PAST SURGERIES      Allergies  Allergen Reactions   Bee Venom Anaphylaxis    And fire ants    Allergies as of 11/13/2022       Reactions   Bee Venom Anaphylaxis   And fire ants        Medication List        Accurate as of November 13, 2022  10:19 AM. If you have any questions, ask your nurse or doctor.          STOP taking these medications    Farxiga 5 MG Tabs tablet Generic drug: dapagliflozin propanediol Stopped by: Donalee Citrin Kourtney Montesinos       TAKE these medications    ALPRAZolam 0.5 MG tablet Commonly known as: XANAX Take 0.5 mg by mouth 2 (two) times daily as needed for anxiety.   aspirin EC 81 MG tablet Take 1 tablet (81 mg total) by mouth daily.   buPROPion 300 MG 24 hr tablet Commonly known as: WELLBUTRIN XL Take 300 mg by mouth daily.   EPINEPHrine 0.3 mg/0.3 mL Soaj injection Commonly known as: EPI-PEN Inject 0.3 mg into the muscle See admin instructions.   FLUoxetine 20 MG capsule Commonly known as: PROZAC Take 20 mg by mouth daily.   glipiZIDE 10 MG 24 hr tablet Commonly known as: GLUCOTROL XL Take 10 mg by mouth daily.   metFORMIN 1000 MG tablet Commonly known as: GLUCOPHAGE Take 1,000 mg by mouth 2 (two) times daily with a meal.   MULTIVITAMIN PO Take 1 tablet by mouth daily.   valACYclovir 1000 MG tablet Commonly known as: VALTREX Take 1,000 mg by mouth daily.        Review of  Systems  Constitutional:  Negative for appetite change, chills, fatigue, fever and unexpected weight change.  HENT:  Negative for congestion, dental problem, ear discharge, ear pain, facial swelling, hearing loss, nosebleeds, postnasal drip, rhinorrhea, sinus pressure, sinus pain, sneezing, sore throat, tinnitus and trouble swallowing.   Eyes:  Negative for pain, discharge, redness, itching and visual disturbance.  Respiratory:  Negative for cough, chest tightness, shortness of breath and wheezing.   Cardiovascular:  Negative for chest pain, palpitations and leg swelling.  Gastrointestinal:  Negative for abdominal distention, abdominal pain, blood in stool, constipation, diarrhea, nausea and vomiting.  Endocrine: Negative for cold intolerance, heat intolerance, polydipsia, polyphagia and polyuria.   Genitourinary:  Negative for difficulty urinating, dysuria, flank pain, frequency and urgency.  Musculoskeletal:  Negative for arthralgias, back pain, gait problem, joint swelling, myalgias, neck pain and neck stiffness.  Skin:  Negative for color change, pallor, rash and wound.  Neurological:  Negative for dizziness, syncope, speech difficulty, weakness, light-headedness, numbness and headaches.  Hematological:  Does not bruise/bleed easily.  Psychiatric/Behavioral:  Positive for sleep disturbance. Negative for agitation, behavioral problems, confusion, hallucinations, self-injury and suicidal ideas. The patient is nervous/anxious.        Fearful and paranoid  Sleeps 5-7 hrs sometimes does not sleep well due to Tinnitus     Immunization History  Administered Date(s) Administered   PFIZER(Purple Top)SARS-COV-2 Vaccination 06/02/2019, 06/27/2019   Pertinent  Health Maintenance Due  Topic Date Due   HEMOGLOBIN A1C  Never done   FOOT EXAM  Never done   OPHTHALMOLOGY EXAM  Never done   Colonoscopy  Never done   INFLUENZA VACCINE  Never done      11/13/2022    9:53 AM  Fall Risk  Falls in the past year? 0   Functional Status Survey:    Vitals:   11/13/22 0957  BP: (!) 142/92  Pulse: 73  Resp: 14  Temp: (!) 97.3 F (36.3 C)  SpO2: 98%  Weight: 227 lb (103 kg)  Height: 5\' 10"  (1.778 m)   Body mass index is 32.57 kg/m. Physical Exam Vitals reviewed.  Constitutional:      General: He is not in acute distress.    Appearance: Normal appearance. He is normal weight. He is not ill-appearing or diaphoretic.  HENT:     Head: Normocephalic.     Right Ear: Tympanic membrane, ear canal and external ear normal. There is no impacted cerumen.     Left Ear: Tympanic membrane, ear canal and external ear normal. There is no impacted cerumen.     Nose: Nose normal. No congestion or rhinorrhea.     Mouth/Throat:     Mouth: Mucous membranes are moist.     Pharynx: Oropharynx is clear. No  oropharyngeal exudate or posterior oropharyngeal erythema.  Eyes:     General: No scleral icterus.       Right eye: No discharge.        Left eye: No discharge.     Extraocular Movements: Extraocular movements intact.     Conjunctiva/sclera: Conjunctivae normal.     Pupils: Pupils are equal, round, and reactive to light.  Neck:     Vascular: No carotid bruit.  Cardiovascular:     Rate and Rhythm: Normal rate and regular rhythm.     Pulses: Normal pulses.     Heart sounds: Normal heart sounds. No murmur heard.    No friction rub. No gallop.  Pulmonary:     Effort: Pulmonary effort is normal.  No respiratory distress.     Breath sounds: Normal breath sounds. No wheezing, rhonchi or rales.  Chest:     Chest wall: No tenderness.  Abdominal:     General: Bowel sounds are normal. There is no distension.     Palpations: Abdomen is soft. There is no mass.     Tenderness: There is no abdominal tenderness. There is no right CVA tenderness, left CVA tenderness, guarding or rebound.  Musculoskeletal:        General: No swelling or tenderness. Normal range of motion.     Cervical back: Normal range of motion. No rigidity or tenderness.     Right lower leg: No edema.     Left lower leg: No edema.  Lymphadenopathy:     Cervical: No cervical adenopathy.  Skin:    General: Skin is warm and dry.     Coloration: Skin is not pale.     Findings: No bruising, erythema, lesion or rash.  Neurological:     Mental Status: He is alert and oriented to person, place, and time.     Cranial Nerves: No cranial nerve deficit.     Sensory: No sensory deficit.     Motor: No weakness.     Coordination: Coordination normal.     Gait: Gait normal.  Psychiatric:        Mood and Affect: Mood is anxious.        Speech: Speech normal.        Behavior: Behavior normal.        Thought Content: Thought content is paranoid.        Judgment: Judgment normal.     Labs reviewed: No results for input(s): "NA", "K",  "CL", "CO2", "GLUCOSE", "BUN", "CREATININE", "CALCIUM", "MG", "PHOS" in the last 8760 hours. No results for input(s): "AST", "ALT", "ALKPHOS", "BILITOT", "PROT", "ALBUMIN" in the last 8760 hours. No results for input(s): "WBC", "NEUTROABS", "HGB", "HCT", "MCV", "PLT" in the last 8760 hours. No results found for: "TSH" No results found for: "HGBA1C" No results found for: "CHOL", "HDL", "LDLCALC", "LDLDIRECT", "TRIG", "CHOLHDL"  Significant Diagnostic Results in last 30 days:  No results found.  Assessment/Plan 1. Diabetes mellitus due to underlying condition with unspecified complications (HCC) No Recent A1C or home CBG for review - Microalbumin / creatinine urine ratio - Lipid panel - TSH - COMPLETE METABOLIC PANEL WITH GFR - CBC with Differential/Platelet - glipiZIDE (GLUCOTROL XL) 10 MG 24 hr tablet; Take 1 tablet (10 mg total) by mouth daily.  Dispense: 90 tablet; Refill: 1  2. Generalized anxiety disorder - continue on Fluoxetine and Wellbutrin  - TSH  3. Current moderate episode of major depressive disorder, unspecified whether recurrent (HCC) Continue on Fluoxetine and Wellbutrin  - TSH - buPROPion (WELLBUTRIN XL) 300 MG 24 hr tablet; Take 1 tablet (300 mg total) by mouth daily.  Dispense: 90 tablet; Refill: 1 4. Encounter for hepatitis C screening test for low risk patient Low risk  - Hepatitis C antibody  5. Screen for STD (sexually transmitted disease) No high risk behaviors reported  - HIV Antibody (routine testing w rflx)  6. Primary hypertension B/p not controlled off medication  - start on Losartan as below  - dietary modification and exercise at least three times per week for 30 minutes - TSH - COMPLETE METABOLIC PANEL WITH GFR - CBC with Differential/Platelet - losartan (COZAAR) 25 MG tablet; Take 1 tablet (25 mg total) by mouth daily.  Dispense: 90 tablet; Refill: 1  7.  Encounter to establish care Immunization due for Tdap and COVID 19 vaccine advised to  bring immunization record and will update.  Advised to schedule for fasting blood work.  8. Therapeutic drug monitoring On alprazolam  PDMP  - DRUG MONITORING, PANEL 6 WITH CONFIRMATION, URINE  Family/ staff Communication: Reviewed plan of care with patient verbalized understanding  Labs/tests ordered:  - TSH - COMPLETE METABOLIC PANEL WITH GFR - CBC with Differential/Platelet - DRUG MONITORING, PANEL 6 WITH CONFIRMATION, URINE  Next Appointment : Return in about 2 weeks (around 11/27/2022) for Blood pressure.   Caesar Bookman, NP

## 2022-11-14 LAB — COMPLETE METABOLIC PANEL WITH GFR
AG Ratio: 1.3 (calc) (ref 1.0–2.5)
ALT: 37 U/L (ref 9–46)
AST: 17 U/L (ref 10–35)
Albumin: 4.4 g/dL (ref 3.6–5.1)
Alkaline phosphatase (APISO): 63 U/L (ref 35–144)
BUN: 7 mg/dL (ref 7–25)
CO2: 27 mmol/L (ref 20–32)
Calcium: 9.2 mg/dL (ref 8.6–10.3)
Chloride: 98 mmol/L (ref 98–110)
Creat: 0.82 mg/dL (ref 0.70–1.30)
Globulin: 3.5 g/dL (ref 1.9–3.7)
Glucose, Bld: 294 mg/dL — ABNORMAL HIGH (ref 65–99)
Potassium: 4.1 mmol/L (ref 3.5–5.3)
Sodium: 134 mmol/L — ABNORMAL LOW (ref 135–146)
Total Bilirubin: 0.4 mg/dL (ref 0.2–1.2)
Total Protein: 7.9 g/dL (ref 6.1–8.1)
eGFR: 107 mL/min/{1.73_m2} (ref >=60)

## 2022-11-14 LAB — DRUG MONITORING, PANEL 6 WITH CONFIRMATION, URINE
6 Acetylmorphine: NEGATIVE ng/mL (ref <10)
Alcohol Metabolites: NEGATIVE ng/mL (ref <500)
Amphetamines: NEGATIVE ng/mL (ref <500)
Barbiturates: NEGATIVE ng/mL (ref <300)
Benzodiazepines: NEGATIVE ng/mL (ref <100)
Cocaine Metabolite: NEGATIVE ng/mL (ref <150)
Creatinine: 96 mg/dL (ref >=20.0)
Marijuana Metabolite: NEGATIVE ng/mL (ref <20)
Methadone Metabolite: NEGATIVE ng/mL (ref <100)
Opiates: NEGATIVE ng/mL (ref <100)
Oxidant: NEGATIVE ug/mL (ref <200)
Oxycodone: NEGATIVE ng/mL (ref <100)
Phencyclidine: NEGATIVE ng/mL (ref <25)
pH: 5.2 (ref 4.5–9.0)

## 2022-11-14 LAB — TSH: TSH: 1.63 m[IU]/L (ref 0.40–4.50)

## 2022-11-14 LAB — CBC WITH DIFFERENTIAL/PLATELET
Absolute Monocytes: 467 {cells}/uL (ref 200–950)
Basophils Absolute: 70 cells/uL (ref 0–200)
Basophils Relative: 1.1 %
Eosinophils Absolute: 211 {cells}/uL (ref 15–500)
Eosinophils Relative: 3.3 %
HCT: 46.7 % (ref 38.5–50.0)
Hemoglobin: 14.8 g/dL (ref 13.2–17.1)
Lymphs Abs: 1850 {cells}/uL (ref 850–3900)
MCH: 26.3 pg — ABNORMAL LOW (ref 27.0–33.0)
MCHC: 31.7 g/dL — ABNORMAL LOW (ref 32.0–36.0)
MCV: 82.9 fL (ref 80.0–100.0)
MPV: 12.5 fL (ref 7.5–12.5)
Monocytes Relative: 7.3 %
Neutro Abs: 3802 {cells}/uL (ref 1500–7800)
Neutrophils Relative %: 59.4 %
Platelets: 148 10*3/uL (ref 140–400)
RBC: 5.63 10*6/uL (ref 4.20–5.80)
RDW: 11.9 % (ref 11.0–15.0)
Total Lymphocyte: 28.9 %
WBC: 6.4 10*3/uL (ref 3.8–10.8)

## 2022-11-14 LAB — LIPID PANEL
Cholesterol: 140 mg/dL (ref <200)
HDL: 69 mg/dL (ref >=40)
LDL Cholesterol (Calc): 55 mg/dL
Non-HDL Cholesterol (Calc): 71 mg/dL (ref <130)
Total CHOL/HDL Ratio: 2 (calc) (ref <5.0)
Triglycerides: 80 mg/dL (ref <150)

## 2022-11-14 LAB — DM TEMPLATE

## 2022-11-14 LAB — HEPATITIS C ANTIBODY: Hepatitis C Ab: NONREACTIVE

## 2022-11-14 LAB — MICROALBUMIN / CREATININE URINE RATIO
Creatinine, Urine: 108 mg/dL (ref 20–320)
Microalb Creat Ratio: 17 mg/g{creat} (ref <30)
Microalb, Ur: 1.8 mg/dL

## 2022-11-14 LAB — HIV ANTIBODY (ROUTINE TESTING W REFLEX): HIV 1&2 Ab, 4th Generation: NONREACTIVE

## 2022-12-02 ENCOUNTER — Ambulatory Visit: Payer: No Typology Code available for payment source | Admitting: Family

## 2022-12-02 ENCOUNTER — Encounter: Payer: Self-pay | Admitting: Family

## 2022-12-02 VITALS — BP 150/98 | HR 81 | Temp 98.1°F | Resp 16 | Ht 70.0 in | Wt 227.0 lb

## 2022-12-02 DIAGNOSIS — I10 Essential (primary) hypertension: Secondary | ICD-10-CM

## 2022-12-02 DIAGNOSIS — E088 Diabetes mellitus due to underlying condition with unspecified complications: Secondary | ICD-10-CM

## 2022-12-02 DIAGNOSIS — F411 Generalized anxiety disorder: Secondary | ICD-10-CM | POA: Diagnosis not present

## 2022-12-02 DIAGNOSIS — Z23 Encounter for immunization: Secondary | ICD-10-CM | POA: Diagnosis not present

## 2022-12-02 MED ORDER — LOSARTAN POTASSIUM 50 MG PO TABS
50.0000 mg | ORAL_TABLET | Freq: Every day | ORAL | 1 refills | Status: DC
Start: 2022-12-02 — End: 2023-01-13

## 2022-12-02 MED ORDER — ALPRAZOLAM 0.5 MG PO TABS: 0.5000 mg | ORAL_TABLET | Freq: Two times a day (BID) | ORAL | 3 refills | Status: DC | PRN

## 2022-12-02 MED ORDER — EPINEPHRINE 0.3 MG/0.3ML IJ SOAJ: 0.3000 mg | INTRAMUSCULAR | 0 refills | Status: DC

## 2022-12-02 MED ORDER — SEMAGLUTIDE 3 MG PO TABS: 3.0000 mg | ORAL_TABLET | Freq: Every day | ORAL | 0 refills | Status: DC

## 2022-12-02 MED ORDER — BLOOD GLUCOSE MONITORING SUPPL DEVI
1.0000 | Freq: Three times a day (TID) | 0 refills | Status: DC
Start: 2022-12-02 — End: 2023-10-19

## 2022-12-02 NOTE — Progress Notes (Signed)
Provider: Richarda Blade FNP-C  Jatasia Gundrum, Donalee Citrin, NP  Patient Care Team: Annaston Upham, Donalee Citrin, NP as PCP - General (Family Medicine)  Extended Emergency Contact Information Primary Emergency Contact: Melito,William Address: [address]          [place], [place] [address] [place] Home Phone: [phone] Relation: Father  Code Status:  Full Code  Goals of care: Advanced Directive information    12/02/2022    1:26 PM  Advanced Directives  Does Patient Have a Medical Advance Directive? No     Chief Complaint  Patient presents with   Follow-up    BP Check    HPI:  Pt is a 52 y.o. male seen today for an acute visit for follow up high blood pressure.He was here 11/13/2022 with high B/p.He was started on Losartan 25 mg tablet daily.No home B/p for review. He denies any headache,dizziness,vision changes,fatigue,chest tightness,palpitation,chest pain or shortness of breath.     Type 2 DM - does not check blood sugar at home.Has no glucometer.States feels like CBG have been high but has adapted to it. Recent Glucose 294   Past Medical History:  Diagnosis Date   Anxiety    Depression    Diabetes mellitus without complication (HCC)    Headache    Herpes simplex    Past Surgical History:  Procedure Laterality Date   NO PAST SURGERIES      Allergies  Allergen Reactions   Bee Venom Anaphylaxis    And fire ants    Outpatient Encounter Medications as of 12/02/2022  Medication Sig   aspirin EC 81 MG tablet Take 1 tablet (81 mg total) by mouth daily.   buPROPion (WELLBUTRIN XL) 300 MG 24 hr tablet Take 1 tablet (300 mg total) by mouth daily.   FLUoxetine (PROZAC) 20 MG capsule Take 20 mg by mouth daily.   glipiZIDE (GLUCOTROL XL) 10 MG 24 hr tablet Take 1 tablet (10 mg total) by mouth daily.   losartan (COZAAR) 25 MG tablet Take 1 tablet (25 mg total) by mouth daily.   Multiple Vitamins-Minerals (MULTIVITAMIN PO) Take 1 tablet by mouth daily.     ALPRAZolam (XANAX) 0.5 MG tablet Take 0.5 mg by mouth 2 (two) times daily as needed for anxiety. (Patient not taking: Reported on 12/02/2022)   EPINEPHrine 0.3 mg/0.3 mL IJ SOAJ injection Inject 0.3 mg into the muscle See admin instructions.  (Patient not taking: Reported on 12/02/2022)   No facility-administered encounter medications on file as of 12/02/2022.    Review of Systems  Constitutional:  Negative for appetite change, chills, fatigue, fever and unexpected weight change.  HENT:  Negative for congestion, ear discharge, ear pain, hearing loss, nosebleeds, postnasal drip, rhinorrhea, sinus pressure, sinus pain, sneezing, sore throat, tinnitus and trouble swallowing.   Eyes:  Negative for pain, discharge, redness, itching and visual disturbance.  Respiratory:  Negative for cough, chest tightness, shortness of breath and wheezing.   Cardiovascular:  Negative for chest pain, palpitations and leg swelling.  Gastrointestinal:  Negative for abdominal distention, abdominal pain, nausea and vomiting.  Genitourinary:  Negative for difficulty urinating, dysuria, flank pain, frequency and urgency.  Musculoskeletal:  Negative for arthralgias, back pain, gait problem, joint swelling, myalgias, neck pain and neck stiffness.  Skin:  Negative for color change, pallor, rash and wound.  Neurological:  Negative for dizziness, syncope, speech difficulty, weakness, light-headedness, numbness and headaches.  Hematological:  Does not bruise/bleed easily.  Psychiatric/Behavioral:  Negative for agitation,  behavioral problems, confusion, hallucinations and sleep disturbance. The patient is not nervous/anxious.     Immunization History  Administered Date(s) Administered   PFIZER(Purple Top)SARS-COV-2 Vaccination 06/02/2019, 06/27/2019   Pertinent  Health Maintenance Due  Topic Date Due   HEMOGLOBIN A1C  Never done   FOOT EXAM  Never done   OPHTHALMOLOGY EXAM  Never done   Colonoscopy  Never done   INFLUENZA  VACCINE  12/08/2022 (Originally 10/08/2022)      11/13/2022    9:53 AM 12/02/2022    1:26 PM  Fall Risk  Falls in the past year? 0 0   Functional Status Survey:    Vitals:   12/02/22 1327 12/02/22 1332  BP: (!) 150/100 (!) 150/98  Pulse: 81   Resp: 16   Temp: 98.1 F (36.7 C)   SpO2: 98%   Weight: 227 lb (103 kg)   Height: 5\' 10"  (1.778 m)    Body mass index is 32.57 kg/m. Physical Exam Vitals reviewed.  Constitutional:      General: He is not in acute distress.    Appearance: Normal appearance. He is obese. He is not ill-appearing or diaphoretic.  HENT:     Head: Normocephalic.     Mouth/Throat:     Mouth: Mucous membranes are moist.     Pharynx: Oropharynx is clear. No oropharyngeal exudate or posterior oropharyngeal erythema.  Eyes:     General: No scleral icterus.       Right eye: No discharge.        Left eye: No discharge.     Conjunctiva/sclera: Conjunctivae normal.     Pupils: Pupils are equal, round, and reactive to light.  Neck:     Vascular: No carotid bruit.  Cardiovascular:     Rate and Rhythm: Normal rate and regular rhythm.     Pulses: Normal pulses.     Heart sounds: Normal heart sounds. No murmur heard.    No friction rub. No gallop.  Pulmonary:     Effort: Pulmonary effort is normal. No respiratory distress.     Breath sounds: Normal breath sounds. No wheezing, rhonchi or rales.  Chest:     Chest wall: No tenderness.  Abdominal:     General: Bowel sounds are normal. There is no distension.     Palpations: Abdomen is soft. There is no mass.     Tenderness: There is no abdominal tenderness. There is no right CVA tenderness, left CVA tenderness, guarding or rebound.  Musculoskeletal:        General: No swelling or tenderness. Normal range of motion.     Cervical back: Normal range of motion. No rigidity or tenderness.     Right lower leg: No edema.     Left lower leg: No edema.  Lymphadenopathy:     Cervical: No cervical adenopathy.  Skin:     General: Skin is warm and dry.     Coloration: Skin is not pale.     Findings: No erythema or rash.  Neurological:     Mental Status: He is alert and oriented to person, place, and time.     Motor: No weakness.     Gait: Gait normal.  Psychiatric:        Mood and Affect: Mood normal.        Speech: Speech normal.        Behavior: Behavior normal.     Labs reviewed: Recent Labs    11/13/22 1111  NA 134*  K 4.1  CL 98  CO2 27  GLUCOSE 294*  BUN 7  CREATININE 0.82  CALCIUM 9.2   Recent Labs    11/13/22 1111  AST 17  ALT 37  BILITOT 0.4  PROT 7.9   Recent Labs    11/13/22 1111  WBC 6.4  NEUTROABS 3,802  HGB 14.8  HCT 46.7  MCV 82.9  PLT 148   Lab Results  Component Value Date   TSH 1.63 11/13/2022   No results found for: "HGBA1C" Lab Results  Component Value Date   CHOL 140 11/13/2022   HDL 69 11/13/2022   LDLCALC 55 11/13/2022   TRIG 80 11/13/2022   CHOLHDL 2.0 11/13/2022    Significant Diagnostic Results in last 30 days:  No results found.  Assessment/Plan 1. Primary hypertension Blood pressure still not at goal -Increase losartan from 25 mg to 50 mg tablet daily -Dietary modification and exercise advised - losartan (COZAAR) 50 MG tablet; Take 1 tablet (50 mg total) by mouth daily.  Dispense: 90 tablet; Refill: 1  2. Diabetes mellitus due to underlying condition with unspecified complications (HCC) No A1c for review -Continue on glipizide and semaglutide -Continue on aspirin for cardiac event prevention - Hemoglobin A1c - Blood Glucose Monitoring Suppl DEVI; 1 each by Does not apply route in the morning, at noon, and at bedtime. May substitute to any manufacturer covered by patient's insurance.  Dispense: 1 each; Refill: 0  3. Generalized anxiety disorder Continue on alprazolam as needed  4. Need for influenza vaccination Afebrile Flut shot administered by CMA no acute reaction reported.  - Flu Vaccine Trivalent High Dose  (Fluad)  Family/ staff Communication: Reviewed plan of care with patient verbalized understanding  Labs/tests ordered:  - Hemoglobin A1c  Next Appointment: Return in about 1 month (around 01/01/2023) for Hypertension and Type 2 DM .   Caesar Bookman, NP

## 2022-12-03 LAB — HEMOGLOBIN A1C
Hgb A1c MFr Bld: 12 % of total Hgb — ABNORMAL HIGH (ref <5.7)
Mean Plasma Glucose: 298 mg/dL
eAG (mmol/L): 16.5 mmol/L

## 2022-12-07 DIAGNOSIS — F411 Generalized anxiety disorder: Secondary | ICD-10-CM | POA: Insufficient documentation

## 2022-12-07 DIAGNOSIS — I10 Essential (primary) hypertension: Secondary | ICD-10-CM | POA: Insufficient documentation

## 2022-12-16 ENCOUNTER — Ambulatory Visit: Payer: No Typology Code available for payment source | Admitting: Family

## 2022-12-30 ENCOUNTER — Ambulatory Visit (INDEPENDENT_AMBULATORY_CARE_PROVIDER_SITE_OTHER): Payer: No Typology Code available for payment source | Admitting: Family

## 2022-12-30 ENCOUNTER — Encounter: Payer: Self-pay | Admitting: Family

## 2022-12-30 ENCOUNTER — Other Ambulatory Visit: Payer: Self-pay | Admitting: Family

## 2022-12-30 VITALS — BP 160/100 | HR 80 | Temp 97.5°F | Resp 20 | Ht 70.0 in | Wt 226.8 lb

## 2022-12-30 DIAGNOSIS — I10 Essential (primary) hypertension: Secondary | ICD-10-CM

## 2022-12-30 DIAGNOSIS — E088 Diabetes mellitus due to underlying condition with unspecified complications: Secondary | ICD-10-CM | POA: Diagnosis not present

## 2022-12-30 DIAGNOSIS — Z1211 Encounter for screening for malignant neoplasm of colon: Secondary | ICD-10-CM

## 2022-12-30 DIAGNOSIS — L84 Corns and callosities: Secondary | ICD-10-CM | POA: Diagnosis not present

## 2022-12-30 MED ORDER — FREESTYLE LIBRE 3 SENSOR MISC: 1.0000 | 11 refills | Status: DC

## 2022-12-30 MED ORDER — AMLODIPINE BESYLATE 5 MG PO TABS: 5.0000 mg | ORAL_TABLET | Freq: Every day | ORAL | 1 refills | Status: DC

## 2022-12-30 MED ORDER — CLONIDINE HCL 0.1 MG PO TABS
0.1000 mg | ORAL_TABLET | Freq: Once | ORAL | Status: AC
  Administered 2022-12-30: 0.1 mg via ORAL

## 2022-12-30 MED ORDER — SEMAGLUTIDE 7 MG PO TABS
7.0000 mg | ORAL_TABLET | ORAL | 0 refills | Status: DC
Start: 2022-12-30 — End: 2022-12-31

## 2022-12-30 NOTE — Progress Notes (Signed)
Provider: Richarda Blade FNP-C  Obryan Radu, Donalee Citrin, NP  Patient Care Team: Danyka Merlin, Donalee Citrin, NP as PCP - General (Family Medicine) Illene Labrador, OD (Optometry)  Extended Emergency Contact Information Primary Emergency Contact: Ayo,William Address: [address]          [place], [place] [address] [place] Home Phone: [phone] Relation: Father  Code Status:  Full Code  Goals of care: Advanced Directive information    12/02/2022    1:26 PM  Advanced Directives  Does Patient Have a Medical Advance Directive? No     Chief Complaint  Patient presents with   Medical Management of Chronic Issues    One month follow up    HPI:  Pt is a 52 y.o. male seen today for follow up for high blood sugars.  His latest Hgb A1C 12.0 done 12/02/2022.  He was started on semaglutide 3 mg tablet and advised to check blood sugars at home then follow-up today for reevaluation. His blood pressure was also elevated on previous visit 150/100 recheck was 150/98 he was advised to increase losartan from 25 mg to 50 mg tablet daily then follow-up today.  Blood pressure continues to be elevated this visit.  He denies any acute issues.  He is due for colon cancer screening Cologuard versus colonoscopy discussed prefers colonoscopy.  Made aware will put referral then gastroenterologist office will call him to schedule an appointment.  Also due for zoster, Tdap and COVID-19 vaccine.  Made aware to get immunization at the pharmacy.  Past Medical History:  Diagnosis Date   Anxiety    Depression    Diabetes mellitus without complication (HCC)    Headache    Herpes simplex    Past Surgical History:  Procedure Laterality Date   NO PAST SURGERIES      Allergies  Allergen Reactions   Bee Venom Anaphylaxis    And fire ants    Outpatient Encounter Medications as of 12/30/2022  Medication Sig   ALPRAZolam (XANAX) 0.5 MG tablet Take 1 tablet (0.5 mg total) by mouth 2 (two)  times daily as needed for anxiety.   amLODipine (NORVASC) 5 MG tablet Take 1 tablet (5 mg total) by mouth daily.   aspirin EC 81 MG tablet Take 1 tablet (81 mg total) by mouth daily.   buPROPion (WELLBUTRIN XL) 300 MG 24 hr tablet Take 1 tablet (300 mg total) by mouth daily.   Continuous Glucose Sensor (FREESTYLE LIBRE 3 SENSOR) MISC 1 Device by Does not apply route every 14 (fourteen) days. Place 1 sensor on the skin every 14 days. Use to check glucose continuously   EPINEPHrine 0.3 mg/0.3 mL IJ SOAJ injection Inject 0.3 mg into the muscle See admin instructions.   FLUoxetine (PROZAC) 20 MG capsule Take 20 mg by mouth daily.   glipiZIDE (GLUCOTROL XL) 10 MG 24 hr tablet Take 1 tablet (10 mg total) by mouth daily.   losartan (COZAAR) 50 MG tablet Take 1 tablet (50 mg total) by mouth daily.   Multiple Vitamins-Minerals (MULTIVITAMIN PO) Take 1 tablet by mouth daily.    [DISCONTINUED] Semaglutide 3 MG TABS Take 1 tablet (3 mg total) by mouth daily.   [DISCONTINUED] Semaglutide 7 MG TABS Take 1 tablet (7 mg total) by mouth once a week.   Blood Glucose Monitoring Suppl DEVI 1 each by Does not apply route in the morning, at noon, and at bedtime. May substitute to any manufacturer covered by patient's insurance. (Patient not  taking: Reported on 12/30/2022)   [EXPIRED] cloNIDine (CATAPRES) tablet 0.1 mg    No facility-administered encounter medications on file as of 12/30/2022.    Review of Systems  Constitutional:  Negative for appetite change, chills, fatigue, fever and unexpected weight change.  HENT:  Negative for congestion, dental problem, ear discharge, ear pain, facial swelling, hearing loss, nosebleeds, postnasal drip, rhinorrhea, sinus pressure, sinus pain, sneezing, sore throat, tinnitus and trouble swallowing.   Eyes:  Negative for pain, discharge, redness, itching and visual disturbance.  Respiratory:  Negative for cough, chest tightness, shortness of breath and wheezing.    Cardiovascular:  Negative for chest pain, palpitations and leg swelling.  Gastrointestinal:  Negative for abdominal distention, abdominal pain, blood in stool, constipation, diarrhea, nausea and vomiting.  Endocrine: Negative for cold intolerance, heat intolerance, polydipsia, polyphagia and polyuria.  Genitourinary:  Negative for difficulty urinating, dysuria, flank pain, frequency and urgency.  Musculoskeletal:  Negative for arthralgias, back pain, gait problem, joint swelling, myalgias, neck pain and neck stiffness.  Skin:  Negative for color change, pallor, rash and wound.  Neurological:  Negative for dizziness, syncope, speech difficulty, weakness, light-headedness, numbness and headaches.  Hematological:  Does not bruise/bleed easily.  Psychiatric/Behavioral:  Negative for agitation, behavioral problems, confusion, hallucinations, self-injury, sleep disturbance and suicidal ideas. The patient is not nervous/anxious.     Immunization History  Administered Date(s) Administered   DTaP 12/10/1972, 02/08/1973, 04/12/1973, 03/28/1974   Fluad Trivalent(High Dose 65+) 12/02/2022   Hepatitis B, PED/ADOLESCENT 06/18/2003, 07/18/2003, 12/25/2003   Influenza Split 08/30/1990, 02/19/2012, 12/20/2014   Influenza,inj,Quad PF,6+ Mos 01/08/2016, 01/15/2017, 02/18/2018, 01/20/2019, 02/09/2020   Influenza,inj,quad, With Preservative 12/15/2013   MMR 11/01/1973, 12/07/1975   Meningococcal polysaccharide vaccine (MPSV4) 08/30/1990   OPV 12/10/1972, 02/08/1973, 04/12/1973, 03/28/1974, 08/30/1990   PFIZER(Purple Top)SARS-COV-2 Vaccination 06/02/2019, 06/27/2019   PPD Test 08/30/1990, 06/18/2003   Pneumococcal Polysaccharide-23 07/19/2013   Td (Adult) 08/30/1990   Tdap 02/19/2012   Pertinent  Health Maintenance Due  Topic Date Due   OPHTHALMOLOGY EXAM  Never done   Colonoscopy  Never done   HEMOGLOBIN A1C  06/01/2023   FOOT EXAM  12/30/2023   INFLUENZA VACCINE  Completed      11/13/2022    9:53  AM 12/02/2022    1:26 PM 12/30/2022    9:50 AM  Fall Risk  Falls in the past year? 0 0 0  Was there an injury with Fall?   0  Fall Risk Category Calculator   0  Patient at Risk for Falls Due to   No Fall Risks  Fall risk Follow up   Falls evaluation completed   Functional Status Survey:    Vitals:   12/30/22 1100 01/06/23 1120  BP: (!) 160/102 (!) 160/100  Pulse: 80   Resp: 20   Temp: (!) 97.5 F (36.4 C)   TempSrc: Temporal   SpO2: 98%   Weight: 226 lb 12.8 oz (102.9 kg)   Height: 5\' 10"  (1.778 m)    Body mass index is 32.54 kg/m. Physical Exam Vitals reviewed.  Constitutional:      General: He is not in acute distress.    Appearance: Normal appearance. He is obese. He is not ill-appearing or diaphoretic.  HENT:     Head: Normocephalic.     Right Ear: Tympanic membrane, ear canal and external ear normal. There is no impacted cerumen.     Left Ear: Tympanic membrane, ear canal and external ear normal. There is no impacted cerumen.  Nose: Nose normal. No congestion or rhinorrhea.     Mouth/Throat:     Mouth: Mucous membranes are moist.     Pharynx: Oropharynx is clear. No oropharyngeal exudate or posterior oropharyngeal erythema.  Eyes:     General: No scleral icterus.       Right eye: No discharge.        Left eye: No discharge.     Extraocular Movements: Extraocular movements intact.     Conjunctiva/sclera: Conjunctivae normal.     Pupils: Pupils are equal, round, and reactive to light.  Neck:     Vascular: No carotid bruit.  Cardiovascular:     Rate and Rhythm: Normal rate and regular rhythm.     Pulses: Normal pulses.     Heart sounds: Normal heart sounds. No murmur heard.    No friction rub. No gallop.  Pulmonary:     Effort: Pulmonary effort is normal. No respiratory distress.     Breath sounds: Normal breath sounds. No wheezing, rhonchi or rales.  Chest:     Chest wall: No tenderness.  Abdominal:     General: Bowel sounds are normal. There is no  distension.     Palpations: Abdomen is soft. There is no mass.     Tenderness: There is no abdominal tenderness. There is no right CVA tenderness, left CVA tenderness, guarding or rebound.  Musculoskeletal:        General: No swelling or tenderness. Normal range of motion.     Cervical back: Normal range of motion. No rigidity or tenderness.     Right lower leg: No edema.     Left lower leg: No edema.  Feet:     Right foot:     Skin integrity: Callus present. No skin breakdown, erythema, dry skin or fissure.     Left foot:     Skin integrity: Callus present. No skin breakdown, erythema, warmth or fissure.  Lymphadenopathy:     Cervical: No cervical adenopathy.  Skin:    General: Skin is warm and dry.     Coloration: Skin is not pale.     Findings: No bruising, erythema, lesion or rash.  Neurological:     Mental Status: He is alert and oriented to person, place, and time.     Cranial Nerves: No cranial nerve deficit.     Sensory: No sensory deficit.     Motor: No weakness.     Coordination: Coordination normal.     Gait: Gait normal.  Psychiatric:        Mood and Affect: Mood normal.        Speech: Speech normal.        Behavior: Behavior normal.        Thought Content: Thought content normal.        Judgment: Judgment normal.     Labs reviewed: Recent Labs    11/13/22 1111  NA 134*  K 4.1  CL 98  CO2 27  GLUCOSE 294*  BUN 7  CREATININE 0.82  CALCIUM 9.2   Recent Labs    11/13/22 1111  AST 17  ALT 37  BILITOT 0.4  PROT 7.9   Recent Labs    11/13/22 1111  WBC 6.4  NEUTROABS 3,802  HGB 14.8  HCT 46.7  MCV 82.9  PLT 148   Lab Results  Component Value Date   TSH 1.63 11/13/2022   Lab Results  Component Value Date   HGBA1C 12.0 (H) 12/02/2022   Lab Results  Component Value Date   CHOL 140 11/13/2022   HDL 69 11/13/2022   LDLCALC 55 11/13/2022   TRIG 80 11/13/2022   CHOLHDL 2.0 11/13/2022    Significant Diagnostic Results in last 30 days:   No results found.  Assessment/Plan  1. Diabetes mellitus due to underlying condition with unspecified complications (HCC) Lab Results  Component Value Date   HGBA1C 12.0 (H) 12/02/2022  - increase Rybelsus from 3 mg tablet to 7 mg tablet daily -Continue on glipizide - Continuous Glucose Sensor (FREESTYLE LIBRE 3 SENSOR) MISC; 1 Device by Does not apply route every 14 (fourteen) days. Place 1 sensor on the skin every 14 days. Use to check glucose continuously  Dispense: 2 each; Refill: 11 - Ambulatory referral to Podiatry  2. Primary hypertension Blood pressure still elevated -Start on amlodipine 5 mg tablet daily consider increasing to 10 mg if systolic blood pressure still above 140 -Continue on losartan 50 mg tablet daily - amLODipine (NORVASC) 5 MG tablet; Take 1 tablet (5 mg total) by mouth daily.  Dispense: 90 tablet; Refill: 1 - cloNIDine (CATAPRES) tablet 0.1 mg - For home use only DME Other see comment: Blood pressure cuff used to check blood pressure daily  3. Colon cancer screening Cologuard versus colonoscopy discussed prefers colonoscopy will refer to gastroenterology. - Ambulatory referral to Gastroenterology  4. Callus of foot Bilateral great toenails callus noted. -Advised to moisturize legs daily - Ambulatory referral to Podiatry  Family/ staff Communication: Reviewed plan of care with patient verbalized understanding  Labs/tests ordered: None   Next Appointment: Return in about 2 weeks (around 01/13/2023) for Blood pressure and Blood sugars .   Caesar Bookman, NP

## 2022-12-30 NOTE — Telephone Encounter (Signed)
Pharmacy need verification on medication. Pharmacy states that the medication is usually a daily prescription.

## 2023-01-06 ENCOUNTER — Encounter: Payer: Self-pay | Admitting: Family

## 2023-01-13 ENCOUNTER — Ambulatory Visit: Payer: No Typology Code available for payment source | Admitting: Family

## 2023-01-13 ENCOUNTER — Encounter: Payer: Self-pay | Admitting: Family

## 2023-01-13 VITALS — BP 160/110 | HR 82 | Temp 97.5°F | Resp 20 | Ht 70.0 in | Wt 225.6 lb

## 2023-01-13 DIAGNOSIS — I10 Essential (primary) hypertension: Secondary | ICD-10-CM | POA: Diagnosis not present

## 2023-01-13 MED ORDER — LOSARTAN POTASSIUM 100 MG PO TABS: 100.0000 mg | ORAL_TABLET | Freq: Every day | ORAL | 1 refills | Status: DC

## 2023-01-13 MED ORDER — AMLODIPINE BESYLATE 10 MG PO TABS: 10.0000 mg | ORAL_TABLET | Freq: Every day | ORAL | 1 refills | Status: DC

## 2023-01-13 NOTE — Patient Instructions (Signed)

## 2023-01-13 NOTE — Progress Notes (Signed)
Provider: Richarda Blade FNP-C  Dawan Farney, Donalee Citrin, NP  Patient Care Team: Cornesha Radziewicz, Donalee Citrin, NP as PCP - General (Family Medicine) Illene Labrador, OD (Optometry)  Extended Emergency Contact Information Primary Emergency Contact: Bledsoe,William Address: [address]          [place], [place] [address] [place] Home Phone: [phone] Relation: Father  Code Status: Full Code  Goals of care: Advanced Directive information    01/13/2023    2:31 PM  Advanced Directives  Does Patient Have a Medical Advance Directive? No  Would patient like information on creating a medical advance directive? No - Patient declined     Chief Complaint  Patient presents with   Medical Management of Chronic Issues    2Week blood pressure and sugar check    HPI:  Pt is a 52 y.o. male seen today for an acute visit for 2 weeks follow up for high blood pressure.He was here on 12/30/2022 B/p was 160/100 amlodipine 5 mg tablet was started and advised to continue with Losartan 50 mg tablet daily. States checks B/p at work SBP readings  runs in the 150's -170's.He denies any headache,dizziness,vision changes,fatigue,chest tightness,palpitation,chest pain or shortness of breath.   B/p today still elevated.   Past Medical History:  Diagnosis Date   Anxiety    Depression    Diabetes mellitus without complication (HCC)    Headache    Herpes simplex    Past Surgical History:  Procedure Laterality Date   NO PAST SURGERIES      Allergies  Allergen Reactions   Bee Venom Anaphylaxis    And fire ants    Outpatient Encounter Medications as of 01/13/2023  Medication Sig   ALPRAZolam (XANAX) 0.5 MG tablet Take 1 tablet (0.5 mg total) by mouth 2 (two) times daily as needed for anxiety.   amLODipine (NORVASC) 5 MG tablet Take 1 tablet (5 mg total) by mouth daily.   aspirin EC 81 MG tablet Take 1 tablet (81 mg total) by mouth daily.   buPROPion (WELLBUTRIN XL) 300 MG 24 hr tablet  Take 1 tablet (300 mg total) by mouth daily.   FLUoxetine (PROZAC) 20 MG capsule Take 20 mg by mouth daily.   glipiZIDE (GLUCOTROL XL) 10 MG 24 hr tablet Take 1 tablet (10 mg total) by mouth daily.   losartan (COZAAR) 50 MG tablet Take 1 tablet (50 mg total) by mouth daily.   Multiple Vitamins-Minerals (MULTIVITAMIN PO) Take 1 tablet by mouth daily.    Semaglutide (RYBELSUS) 7 MG TABS Take 1 tablet (7 mg total) by mouth daily.   Blood Glucose Monitoring Suppl DEVI 1 each by Does not apply route in the morning, at noon, and at bedtime. May substitute to any manufacturer covered by patient's insurance. (Patient not taking: Reported on 12/30/2022)   Continuous Glucose Sensor (FREESTYLE LIBRE 3 SENSOR) MISC 1 Device by Does not apply route every 14 (fourteen) days. Place 1 sensor on the skin every 14 days. Use to check glucose continuously (Patient not taking: Reported on 01/13/2023)   EPINEPHrine 0.3 mg/0.3 mL IJ SOAJ injection Inject 0.3 mg into the muscle See admin instructions. (Patient not taking: Reported on 01/13/2023)   No facility-administered encounter medications on file as of 01/13/2023.    Review of Systems  Constitutional:  Negative for appetite change, chills, fatigue, fever and unexpected weight change.  HENT:  Negative for congestion, dental problem, ear discharge, ear pain, facial swelling, hearing loss, nosebleeds, postnasal drip,  rhinorrhea, sinus pressure, sinus pain, sneezing, sore throat, tinnitus and trouble swallowing.   Eyes:  Negative for pain, discharge, redness, itching and visual disturbance.  Respiratory:  Negative for cough, chest tightness, shortness of breath and wheezing.   Cardiovascular:  Negative for chest pain, palpitations and leg swelling.  Gastrointestinal:  Negative for abdominal distention, abdominal pain, blood in stool, constipation, diarrhea, nausea and vomiting.  Endocrine: Negative for cold intolerance, heat intolerance, polydipsia, polyphagia and  polyuria.  Genitourinary:  Negative for difficulty urinating, dysuria, flank pain, frequency and urgency.  Musculoskeletal:  Negative for arthralgias, back pain, gait problem, joint swelling, myalgias, neck pain and neck stiffness.  Skin:  Negative for color change, pallor, rash and wound.  Neurological:  Negative for dizziness, syncope, speech difficulty, weakness, light-headedness, numbness and headaches.  Hematological:  Does not bruise/bleed easily.  Psychiatric/Behavioral:  Negative for agitation, behavioral problems, confusion, hallucinations, self-injury, sleep disturbance and suicidal ideas. The patient is not nervous/anxious.     Immunization History  Administered Date(s) Administered   DTaP 12/10/1972, 02/08/1973, 04/12/1973, 03/28/1974   Fluad Trivalent(High Dose 65+) 12/02/2022   Hepatitis B, PED/ADOLESCENT 06/18/2003, 07/18/2003, 12/25/2003   Influenza Split 08/30/1990, 02/19/2012, 12/20/2014   Influenza,inj,Quad PF,6+ Mos 01/08/2016, 01/15/2017, 02/18/2018, 01/20/2019, 02/09/2020   Influenza,inj,quad, With Preservative 12/15/2013   MMR 11/01/1973, 12/07/1975   Meningococcal polysaccharide vaccine (MPSV4) 08/30/1990   OPV 12/10/1972, 02/08/1973, 04/12/1973, 03/28/1974, 08/30/1990   PFIZER(Purple Top)SARS-COV-2 Vaccination 06/02/2019, 06/27/2019   PPD Test 08/30/1990, 06/18/2003   Pneumococcal Polysaccharide-23 07/19/2013   Td (Adult) 08/30/1990   Tdap 02/19/2012   Pertinent  Health Maintenance Due  Topic Date Due   OPHTHALMOLOGY EXAM  Never done   Colonoscopy  Never done   HEMOGLOBIN A1C  06/01/2023   FOOT EXAM  12/30/2023   INFLUENZA VACCINE  Completed      11/13/2022    9:53 AM 12/02/2022    1:26 PM 12/30/2022    9:50 AM 01/13/2023    2:28 PM  Fall Risk  Falls in the past year? 0 0 0 0  Was there an injury with Fall?   0 0  Fall Risk Category Calculator   0 0  Patient at Risk for Falls Due to   No Fall Risks No Fall Risks  Fall risk Follow up   Falls evaluation  completed    Functional Status Survey:    Vitals:   01/13/23 1433 01/13/23 1438  BP: (!) 150/98 (!) 160/110  Pulse: 82   Resp: 20   Temp: (!) 97.5 F (36.4 C)   SpO2: 98%   Weight: 225 lb 9.6 oz (102.3 kg)   Height: 5\' 10"  (1.778 m)    Body mass index is 32.37 kg/m. Physical Exam Vitals reviewed.  Constitutional:      General: He is not in acute distress.    Appearance: Normal appearance. He is obese. He is not ill-appearing or diaphoretic.  HENT:     Head: Normocephalic.     Right Ear: Tympanic membrane, ear canal and external ear normal. There is no impacted cerumen.     Left Ear: Tympanic membrane, ear canal and external ear normal. There is no impacted cerumen.     Nose: Nose normal. No congestion or rhinorrhea.     Mouth/Throat:     Mouth: Mucous membranes are moist.     Pharynx: Oropharynx is clear. No oropharyngeal exudate or posterior oropharyngeal erythema.  Eyes:     General: No scleral icterus.       Right eye:  No discharge.        Left eye: No discharge.     Extraocular Movements: Extraocular movements intact.     Conjunctiva/sclera: Conjunctivae normal.     Pupils: Pupils are equal, round, and reactive to light.  Neck:     Vascular: No carotid bruit.  Cardiovascular:     Rate and Rhythm: Normal rate and regular rhythm.     Pulses: Normal pulses.     Heart sounds: Normal heart sounds. No murmur heard.    No friction rub. No gallop.  Pulmonary:     Effort: Pulmonary effort is normal. No respiratory distress.     Breath sounds: Normal breath sounds. No wheezing, rhonchi or rales.  Chest:     Chest wall: No tenderness.  Abdominal:     General: Bowel sounds are normal. There is no distension.     Palpations: Abdomen is soft. There is no mass.     Tenderness: There is no abdominal tenderness. There is no right CVA tenderness, left CVA tenderness, guarding or rebound.  Musculoskeletal:        General: No swelling or tenderness. Normal range of motion.      Cervical back: Normal range of motion. No rigidity or tenderness.     Right lower leg: No edema.     Left lower leg: No edema.  Lymphadenopathy:     Cervical: No cervical adenopathy.  Skin:    General: Skin is warm and dry.     Coloration: Skin is not pale.     Findings: No bruising, erythema, lesion or rash.  Neurological:     Mental Status: He is alert and oriented to person, place, and time.     Cranial Nerves: No cranial nerve deficit.     Sensory: No sensory deficit.     Motor: No weakness.     Coordination: Coordination normal.     Gait: Gait normal.  Psychiatric:        Mood and Affect: Mood normal.        Speech: Speech normal.        Behavior: Behavior normal.        Thought Content: Thought content normal.        Judgment: Judgment normal.    Labs reviewed: Recent Labs    11/13/22 1111  NA 134*  K 4.1  CL 98  CO2 27  GLUCOSE 294*  BUN 7  CREATININE 0.82  CALCIUM 9.2   Recent Labs    11/13/22 1111  AST 17  ALT 37  BILITOT 0.4  PROT 7.9   Recent Labs    11/13/22 1111  WBC 6.4  NEUTROABS 3,802  HGB 14.8  HCT 46.7  MCV 82.9  PLT 148   Lab Results  Component Value Date   TSH 1.63 11/13/2022   Lab Results  Component Value Date   HGBA1C 12.0 (H) 12/02/2022   Lab Results  Component Value Date   CHOL 140 11/13/2022   HDL 69 11/13/2022   LDLCALC 55 11/13/2022   TRIG 80 11/13/2022   CHOLHDL 2.0 11/13/2022    Significant Diagnostic Results in last 30 days:  No results found.  Assessment/Plan  Uncontrolled hypertension B/p still elevated next visit - EKG 12-Lead indicates normal sinus rhythm heart rate 82 no changes compared to previous EKG done 08/30/2017. -Will increase losartan from 50 mg to 100 mg daily and amlodipine from 5 mg to 10 mg daily.  Advised to take 2 tablets of the 5 mg  until its gone. -  Advised to check Blood pressure at home and record on log provided and notify provider if B/p > 140/90  - losartan (COZAAR) 100 MG  tablet; Take 1 tablet (100 mg total) by mouth daily.  Dispense: 90 tablet; Refill: 1 - amLODipine (NORVASC) 10 MG tablet; Take 1 tablet (10 mg total) by mouth daily.  Dispense: 90 tablet; Refill: 1 -Follow-up in 1 week to reevaluate blood pressure  Family/ staff Communication: Reviewed plan of care with patient verbalized understanding  Labs/tests ordered: None   Next Appointment: Return in about 1 week (around 01/20/2023) for Blood pressure.   Caesar Bookman, NP

## 2023-01-18 ENCOUNTER — Ambulatory Visit (INDEPENDENT_AMBULATORY_CARE_PROVIDER_SITE_OTHER): Payer: No Typology Code available for payment source | Admitting: Podiatry

## 2023-01-18 ENCOUNTER — Encounter: Payer: Self-pay | Admitting: Podiatry

## 2023-01-18 VITALS — Ht 70.0 in | Wt 225.6 lb

## 2023-01-18 DIAGNOSIS — E119 Type 2 diabetes mellitus without complications: Secondary | ICD-10-CM | POA: Diagnosis not present

## 2023-01-18 NOTE — Progress Notes (Signed)
   Chief Complaint  Patient presents with   Callouses    Pt is here due to callous on right greater toe, and 1 possible on right heel.    HPI: 52 y.o. male PMHx T2DM, last A1c was 12.0 on 12/02/2022, presenting today as a new patient for diabetic foot evaluation.  No complaints.  He says that he does have some mild ingrown toenail to the right hallux nail plate which he manages on his own.  He has never had any foot complications.  Past Medical History:  Diagnosis Date   Anxiety    Depression    Diabetes mellitus without complication (HCC)    Headache    Herpes simplex     Past Surgical History:  Procedure Laterality Date   NO PAST SURGERIES      Allergies  Allergen Reactions   Bee Venom Anaphylaxis    And fire ants     Physical Exam: General: The patient is alert and oriented x3 in no acute distress.  Dermatology: Skin is warm, dry and supple bilateral lower extremities.  Mild hyperkeratotic callus lesions noted to the medial aspect of the right great toe and posterior heel  Vascular: Palpable pedal pulses bilaterally. Capillary refill within normal limits.  No appreciable edema.  No erythema.  Neurological: Grossly intact via light touch  Musculoskeletal Exam: No pedal deformities noted  Assessment/Plan of Care: 1.  Encounter for diabetic foot exam 2.  Diabetes mellitus; uncontrolled 3.  Mild ingrown toenail right great toe 4.  Normal callus lesions medial aspect of the right great toe and posterior heel  -Patient evaluated.  Comprehensive diabetic foot exam performed today -Stressed the importance of maintaining his diabetes and working closely with his PCP -Light debridement of the callus lesions was performed today with 312 scalpel without incident or bleeding -Advise against going barefoot.  Recommend good supportive shoes and sneakers -Mechanical debridement of the right hallux nail plate was performed today using a nail nipper.  Patient did feel some relief  with this -Maintain good foot hygiene -Return to clinic annually        Felecia Shelling, DPM [facility]  Dr. Felecia Shelling, DPM    [address] [place], [place] [address]                Office [phone]  Fax [phone]

## 2023-01-21 ENCOUNTER — Ambulatory Visit (INDEPENDENT_AMBULATORY_CARE_PROVIDER_SITE_OTHER): Payer: No Typology Code available for payment source | Admitting: Family

## 2023-01-21 ENCOUNTER — Encounter: Payer: Self-pay | Admitting: Family

## 2023-01-21 VITALS — BP 144/78 | HR 81 | Temp 98.2°F | Resp 18 | Ht 70.0 in | Wt 225.6 lb

## 2023-01-21 DIAGNOSIS — I10 Essential (primary) hypertension: Secondary | ICD-10-CM

## 2023-01-21 MED ORDER — HYDROCHLOROTHIAZIDE 25 MG PO TABS: 25.0000 mg | ORAL_TABLET | Freq: Every day | ORAL | 3 refills | Status: DC

## 2023-01-21 NOTE — Progress Notes (Signed)
Provider: Richarda Blade FNP-C  Odelia Graciano, Donalee Citrin, NP  Patient Care Team: Shelvy Heckert, Donalee Citrin, NP as PCP - General (Family Medicine) Illene Labrador, OD (Optometry)  Extended Emergency Contact Information Primary Emergency Contact: Bai,William Address: [address]          [place], [place] [address] [place] Home Phone: [phone] Relation: Father  Code Status: Full Code  Goals of care: Advanced Directive information    01/13/2023    2:31 PM  Advanced Directives  Does Patient Have a Medical Advance Directive? No  Would patient like information on creating a medical advance directive? No - Patient declined     Chief Complaint  Patient presents with   Follow-up    1 week blood pressure    Immunizations    Covid, Shingrix, and DTAP   Quality Metric Gaps    Colonoscopy and Eye Exam    HPI:  Pt is a 52 y.o. male seen today for an acute visit for one week.Recalls readings in the 150's /100's.He denies any headache,dizziness,vision changes,fatigue,chest tightness,palpitation,chest pain or shortness of breath. States has been exercise by doing sit ups and walks at work about two miles.   States has not been checking CBG at home he did not have the [facility] but states  was told it's ready in the pharmacy will be picking up today.     Past Medical History:  Diagnosis Date   Anxiety    Depression    Diabetes mellitus without complication (HCC)    Headache    Herpes simplex    Past Surgical History:  Procedure Laterality Date   NO PAST SURGERIES      Allergies  Allergen Reactions   Bee Venom Anaphylaxis    And fire ants    Outpatient Encounter Medications as of 01/21/2023  Medication Sig   ALPRAZolam (XANAX) 0.5 MG tablet Take 1 tablet (0.5 mg total) by mouth 2 (two) times daily as needed for anxiety.   amLODipine (NORVASC) 10 MG tablet Take 1 tablet (10 mg total) by mouth daily.   aspirin EC 81 MG tablet Take 1 tablet (81 mg  total) by mouth daily.   Blood Glucose Monitoring Suppl DEVI 1 each by Does not apply route in the morning, at noon, and at bedtime. May substitute to any manufacturer covered by patient's insurance.   buPROPion (WELLBUTRIN XL) 300 MG 24 hr tablet Take 1 tablet (300 mg total) by mouth daily.   Continuous Glucose Sensor (FREESTYLE LIBRE 3 SENSOR) MISC 1 Device by Does not apply route every 14 (fourteen) days. Place 1 sensor on the skin every 14 days. Use to check glucose continuously   EPINEPHrine 0.3 mg/0.3 mL IJ SOAJ injection Inject 0.3 mg into the muscle See admin instructions.   FLUoxetine (PROZAC) 20 MG capsule Take 20 mg by mouth daily.   glipiZIDE (GLUCOTROL XL) 10 MG 24 hr tablet Take 1 tablet (10 mg total) by mouth daily.   losartan (COZAAR) 100 MG tablet Take 1 tablet (100 mg total) by mouth daily.   Multiple Vitamins-Minerals (MULTIVITAMIN PO) Take 1 tablet by mouth daily.    Semaglutide (RYBELSUS) 7 MG TABS Take 1 tablet (7 mg total) by mouth daily.   No facility-administered encounter medications on file as of 01/21/2023.    Review of Systems  Constitutional:  Negative for appetite change, chills, fatigue, fever and unexpected weight change.  HENT:  Negative for congestion, dental problem, ear discharge, ear pain, facial swelling,  hearing loss, nosebleeds, postnasal drip, rhinorrhea, sinus pressure, sinus pain, sneezing, sore throat, tinnitus and trouble swallowing.   Eyes:  Negative for pain, discharge, redness, itching and visual disturbance.  Respiratory:  Negative for cough, chest tightness, shortness of breath and wheezing.   Cardiovascular:  Negative for chest pain, palpitations and leg swelling.  Gastrointestinal:  Negative for abdominal distention, abdominal pain, blood in stool, constipation, diarrhea, nausea and vomiting.  Genitourinary:  Negative for difficulty urinating, dysuria, flank pain, frequency and urgency.  Musculoskeletal:  Negative for arthralgias, back pain,  gait problem and joint swelling.  Skin:  Negative for color change, pallor and rash.  Neurological:  Negative for dizziness, weakness, light-headedness, numbness and headaches.    Immunization History  Administered Date(s) Administered   DTaP 12/10/1972, 02/08/1973, 04/12/1973, 03/28/1974   Fluad Trivalent(High Dose 65+) 12/02/2022   Hepatitis B, PED/ADOLESCENT 06/18/2003, 07/18/2003, 12/25/2003   Influenza Split 08/30/1990, 02/19/2012, 12/20/2014   Influenza,inj,Quad PF,6+ Mos 01/08/2016, 01/15/2017, 02/18/2018, 01/20/2019, 02/09/2020   Influenza,inj,quad, With Preservative 12/15/2013   MMR 11/01/1973, 12/07/1975   Meningococcal polysaccharide vaccine (MPSV4) 08/30/1990   OPV 12/10/1972, 02/08/1973, 04/12/1973, 03/28/1974, 08/30/1990   PFIZER(Purple Top)SARS-COV-2 Vaccination 06/02/2019, 06/27/2019   PPD Test 08/30/1990, 06/18/2003   Pneumococcal Polysaccharide-23 07/19/2013   Td (Adult) 08/30/1990   Tdap 02/19/2012   Pertinent  Health Maintenance Due  Topic Date Due   OPHTHALMOLOGY EXAM  Never done   Colonoscopy  Never done   HEMOGLOBIN A1C  06/01/2023   FOOT EXAM  12/30/2023   INFLUENZA VACCINE  Completed      11/13/2022    9:53 AM 12/02/2022    1:26 PM 12/30/2022    9:50 AM 01/13/2023    2:28 PM 01/21/2023   10:24 AM  Fall Risk  Falls in the past year? 0 0 0 0 0  Was there an injury with Fall?   0 0 0  Fall Risk Category Calculator   0 0 0  Patient at Risk for Falls Due to   No Fall Risks No Fall Risks No Fall Risks  Fall risk Follow up   Falls evaluation completed  Falls evaluation completed   Functional Status Survey:    Vitals:   01/21/23 1026 01/21/23 1034  BP: (!) 142/100 (!) 158/100  Pulse: 81   Resp: 18   Temp: 98.2 F (36.8 C)   SpO2: 98%   Weight: 225 lb 9.6 oz (102.3 kg)   Height: 5\' 10"  (1.778 m)    Body mass index is 32.37 kg/m. Physical Exam Vitals reviewed.  Constitutional:      General: He is not in acute distress.    Appearance: Normal  appearance. He is obese. He is not ill-appearing or diaphoretic.  HENT:     Head: Normocephalic.  Eyes:     General: No scleral icterus.       Right eye: No discharge.        Left eye: No discharge.     Conjunctiva/sclera: Conjunctivae normal.     Pupils: Pupils are equal, round, and reactive to light.  Neck:     Vascular: No carotid bruit.  Cardiovascular:     Rate and Rhythm: Normal rate and regular rhythm.     Pulses: Normal pulses.     Heart sounds: Normal heart sounds. No murmur heard.    No friction rub. No gallop.  Pulmonary:     Effort: Pulmonary effort is normal. No respiratory distress.     Breath sounds: Normal breath sounds. No wheezing, rhonchi  or rales.  Chest:     Chest wall: No tenderness.  Abdominal:     General: Bowel sounds are normal. There is no distension.     Palpations: Abdomen is soft. There is no mass.     Tenderness: There is no abdominal tenderness. There is no right CVA tenderness, left CVA tenderness, guarding or rebound.  Musculoskeletal:        General: No swelling or tenderness. Normal range of motion.     Cervical back: Normal range of motion. No rigidity or tenderness.     Right lower leg: No edema.     Left lower leg: No edema.  Lymphadenopathy:     Cervical: No cervical adenopathy.  Skin:    General: Skin is warm and dry.     Coloration: Skin is not pale.     Findings: No erythema or rash.  Neurological:     Mental Status: He is alert and oriented to person, place, and time.     Cranial Nerves: No cranial nerve deficit.     Sensory: No sensory deficit.     Motor: No weakness.     Coordination: Coordination normal.     Gait: Gait normal.  Psychiatric:        Mood and Affect: Mood normal.        Speech: Speech normal.        Behavior: Behavior normal.     Labs reviewed: Recent Labs    11/13/22 1111  NA 134*  K 4.1  CL 98  CO2 27  GLUCOSE 294*  BUN 7  CREATININE 0.82  CALCIUM 9.2   Recent Labs    11/13/22 1111  AST 17   ALT 37  BILITOT 0.4  PROT 7.9   Recent Labs    11/13/22 1111  WBC 6.4  NEUTROABS 3,802  HGB 14.8  HCT 46.7  MCV 82.9  PLT 148   Lab Results  Component Value Date   TSH 1.63 11/13/2022   Lab Results  Component Value Date   HGBA1C 12.0 (H) 12/02/2022   Lab Results  Component Value Date   CHOL 140 11/13/2022   HDL 69 11/13/2022   LDLCALC 55 11/13/2022   TRIG 80 11/13/2022   CHOLHDL 2.0 11/13/2022    Significant Diagnostic Results in last 30 days:  No results found.  Assessment/Plan  Uncontrolled hypertension B/p still elevated but slight improved  -Start on hydrochlorothiazide as below -Continue on losartan and amlodipine -Dietary modification and exercise advised - hydrochlorothiazide (HYDRODIURIL) 25 MG tablet; Take 1 tablet (25 mg total) by mouth daily.  Dispense: 90 tablet; Refill: 3  Family/ staff Communication: Reviewed plan of care with patient verbalized understanding  Labs/tests ordered: None   Next Appointment: Return in about 3 weeks (around 02/11/2023) for Blood pressure.   Caesar Bookman, NP

## 2023-01-21 NOTE — Patient Instructions (Signed)
-   check Blood pressure at home and record on log provided and notify provider if B/p > 140/90  ? ?

## 2023-01-26 ENCOUNTER — Other Ambulatory Visit: Payer: Self-pay | Admitting: Family

## 2023-01-26 DIAGNOSIS — E088 Diabetes mellitus due to underlying condition with unspecified complications: Secondary | ICD-10-CM

## 2023-02-10 ENCOUNTER — Ambulatory Visit (INDEPENDENT_AMBULATORY_CARE_PROVIDER_SITE_OTHER): Payer: No Typology Code available for payment source | Admitting: Family

## 2023-02-10 ENCOUNTER — Encounter: Payer: Self-pay | Admitting: Family

## 2023-02-10 VITALS — BP 132/88 | HR 90 | Temp 98.6°F | Resp 20 | Ht 70.0 in | Wt 226.0 lb

## 2023-02-10 DIAGNOSIS — E088 Diabetes mellitus due to underlying condition with unspecified complications: Secondary | ICD-10-CM | POA: Diagnosis not present

## 2023-02-10 DIAGNOSIS — I10 Essential (primary) hypertension: Secondary | ICD-10-CM

## 2023-02-10 NOTE — Progress Notes (Signed)
Provider: Richarda Blade FNP-C  Envy Meno, Donalee Citrin, NP  Patient Care Team: Denny Lave, Donalee Citrin, NP as PCP - General (Family Medicine) Illene Labrador, OD (Optometry)  Extended Emergency Contact Information Primary Emergency Contact: Banka,William Address: [address]          [place], [place] [address] [place] Home Phone: [phone] Relation: Father  Code Status:  Full Code  Goals of care: Advanced Directive information    01/13/2023    2:31 PM  Advanced Directives  Does Patient Have a Medical Advance Directive? No  Would patient like information on creating a medical advance directive? No - Patient declined     Chief Complaint  Patient presents with   Medical Management of Chronic Issues    Patient presents today for a 3 weeks follow-up   Quality Metric Gaps    Colonoscopy, TDAP, zoster,COVID#3    HPI:  Pt is a 52 y.o. male seen today for an acute visit for follow up for high blood pressure. He was here 3 weeks ago B/p 142/100 recheck was 158/100 hydrochlorothiazide 25 daily was added.Has been taking amlodipine and Losartan. Checks B/p at work but forgot to bring previous provided log.B/p within normal range today.  Referral ordered for colonoscopy done in 12/2022 awaiting appointment.  Due for Tdap ,Zoster and COVID-19 vaccine. Aware to get vaccine at the pharmacy.   Past Medical History:  Diagnosis Date   Anxiety    Depression    Diabetes mellitus without complication (HCC)    Headache    Herpes simplex    Past Surgical History:  Procedure Laterality Date   NO PAST SURGERIES      Allergies  Allergen Reactions   Bee Venom Anaphylaxis    And fire ants    Outpatient Encounter Medications as of 02/10/2023  Medication Sig   ALPRAZolam (XANAX) 0.5 MG tablet Take 1 tablet (0.5 mg total) by mouth 2 (two) times daily as needed for anxiety.   amLODipine (NORVASC) 10 MG tablet Take 1 tablet (10 mg total) by mouth daily.   aspirin EC 81 MG  tablet Take 1 tablet (81 mg total) by mouth daily.   Blood Glucose Monitoring Suppl DEVI 1 each by Does not apply route in the morning, at noon, and at bedtime. May substitute to any manufacturer covered by patient's insurance.   buPROPion (WELLBUTRIN XL) 300 MG 24 hr tablet Take 1 tablet (300 mg total) by mouth daily.   Continuous Glucose Sensor (FREESTYLE LIBRE 3 SENSOR) MISC 1 Device by Does not apply route every 14 (fourteen) days. Place 1 sensor on the skin every 14 days. Use to check glucose continuously   EPINEPHrine 0.3 mg/0.3 mL IJ SOAJ injection Inject 0.3 mg into the muscle See admin instructions.   FLUoxetine (PROZAC) 20 MG capsule Take 20 mg by mouth daily.   glipiZIDE (GLUCOTROL XL) 10 MG 24 hr tablet Take 1 tablet (10 mg total) by mouth daily.   hydrochlorothiazide (HYDRODIURIL) 25 MG tablet Take 1 tablet (25 mg total) by mouth daily.   losartan (COZAAR) 100 MG tablet Take 1 tablet (100 mg total) by mouth daily.   Multiple Vitamins-Minerals (MULTIVITAMIN PO) Take 1 tablet by mouth daily.    Semaglutide (RYBELSUS) 7 MG TABS TAKE 1 TABLET (7 MG TOTAL) BY MOUTH DAILY   No facility-administered encounter medications on file as of 02/10/2023.    Review of Systems  Constitutional:  Negative for appetite change, chills, fatigue, fever and unexpected weight change.  HENT:  Negative for congestion, dental problem, ear discharge, ear pain, facial swelling, hearing loss, nosebleeds, postnasal drip, rhinorrhea, sinus pressure, sinus pain, sneezing, sore throat, tinnitus and trouble swallowing.   Eyes:  Negative for pain, discharge, redness, itching and visual disturbance.  Respiratory:  Negative for cough, chest tightness, shortness of breath and wheezing.   Cardiovascular:  Negative for chest pain, palpitations and leg swelling.  Gastrointestinal:  Negative for abdominal distention, abdominal pain, blood in stool, constipation, diarrhea, nausea and vomiting.  Endocrine: Negative for cold  intolerance, heat intolerance, polydipsia, polyphagia and polyuria.  Genitourinary:  Negative for difficulty urinating, dysuria, flank pain, frequency and urgency.  Musculoskeletal:  Negative for arthralgias, back pain, gait problem, joint swelling, myalgias, neck pain and neck stiffness.  Skin:  Negative for color change, pallor, rash and wound.  Neurological:  Negative for dizziness, syncope, speech difficulty, weakness, light-headedness, numbness and headaches.  Hematological:  Does not bruise/bleed easily.  Psychiatric/Behavioral:  Negative for agitation, behavioral problems, confusion, hallucinations, self-injury, sleep disturbance and suicidal ideas. The patient is not nervous/anxious.     Immunization History  Administered Date(s) Administered   DTaP 12/10/1972, 02/08/1973, 04/12/1973, 03/28/1974   Fluad Trivalent(High Dose 65+) 12/02/2022   Hepatitis B, PED/ADOLESCENT 06/18/2003, 07/18/2003, 12/25/2003   Influenza Split 08/30/1990, 02/19/2012, 12/20/2014   Influenza,inj,Quad PF,6+ Mos 01/08/2016, 01/15/2017, 02/18/2018, 01/20/2019, 02/09/2020   Influenza,inj,quad, With Preservative 12/15/2013   MMR 11/01/1973, 12/07/1975   Meningococcal polysaccharide vaccine (MPSV4) 08/30/1990   OPV 12/10/1972, 02/08/1973, 04/12/1973, 03/28/1974, 08/30/1990   PFIZER(Purple Top)SARS-COV-2 Vaccination 06/02/2019, 06/27/2019   PPD Test 08/30/1990, 06/18/2003   Pneumococcal Polysaccharide-23 07/19/2013   Td (Adult) 08/30/1990   Tdap 02/19/2012   Pertinent  Health Maintenance Due  Topic Date Due   Colonoscopy  Never done   HEMOGLOBIN A1C  06/01/2023   OPHTHALMOLOGY EXAM  08/29/2023   FOOT EXAM  12/30/2023   INFLUENZA VACCINE  Completed      11/13/2022    9:53 AM 12/02/2022    1:26 PM 12/30/2022    9:50 AM 01/13/2023    2:28 PM 01/21/2023   10:24 AM  Fall Risk  Falls in the past year? 0 0 0 0 0  Was there an injury with Fall?   0 0 0  Fall Risk Category Calculator   0 0 0  Patient at Risk  for Falls Due to   No Fall Risks No Fall Risks No Fall Risks  Fall risk Follow up   Falls evaluation completed  Falls evaluation completed   Functional Status Survey:    Vitals:   02/10/23 1027  BP: 132/88  Pulse: 90  Resp: 20  Temp: 98.6 F (37 C)  SpO2: 99%  Weight: 226 lb (102.5 kg)  Height: 5\' 10"  (1.778 m)   Body mass index is 32.43 kg/m. Physical Exam Vitals reviewed.  Constitutional:      General: He is not in acute distress.    Appearance: Normal appearance. He is obese. He is not ill-appearing or diaphoretic.  HENT:     Head: Normocephalic.     Nose: Nose normal. No congestion or rhinorrhea.     Mouth/Throat:     Mouth: Mucous membranes are moist.     Pharynx: Oropharynx is clear. No oropharyngeal exudate or posterior oropharyngeal erythema.  Eyes:     General: No scleral icterus.       Right eye: No discharge.        Left eye: No discharge.     Extraocular Movements: Extraocular movements  intact.     Conjunctiva/sclera: Conjunctivae normal.     Pupils: Pupils are equal, round, and reactive to light.  Neck:     Vascular: No carotid bruit.  Cardiovascular:     Rate and Rhythm: Normal rate and regular rhythm.     Pulses: Normal pulses.     Heart sounds: Normal heart sounds. No murmur heard.    No friction rub. No gallop.  Pulmonary:     Effort: Pulmonary effort is normal. No respiratory distress.     Breath sounds: Normal breath sounds. No wheezing, rhonchi or rales.  Chest:     Chest wall: No tenderness.  Abdominal:     General: Bowel sounds are normal. There is no distension.     Palpations: Abdomen is soft. There is no mass.     Tenderness: There is no abdominal tenderness. There is no right CVA tenderness, left CVA tenderness, guarding or rebound.  Musculoskeletal:        General: No swelling or tenderness. Normal range of motion.     Cervical back: Normal range of motion. No rigidity or tenderness.     Right lower leg: No edema.     Left lower leg:  No edema.  Lymphadenopathy:     Cervical: No cervical adenopathy.  Skin:    General: Skin is warm and dry.     Coloration: Skin is not pale.     Findings: No erythema or rash.  Neurological:     Mental Status: He is alert and oriented to person, place, and time.     Cranial Nerves: No cranial nerve deficit.     Sensory: No sensory deficit.     Motor: No weakness.     Coordination: Coordination normal.     Gait: Gait normal.  Psychiatric:        Mood and Affect: Mood normal.        Speech: Speech normal.        Behavior: Behavior normal.     Labs reviewed: Recent Labs    11/13/22 1111  NA 134*  K 4.1  CL 98  CO2 27  GLUCOSE 294*  BUN 7  CREATININE 0.82  CALCIUM 9.2   Recent Labs    11/13/22 1111  AST 17  ALT 37  BILITOT 0.4  PROT 7.9   Recent Labs    11/13/22 1111  WBC 6.4  NEUTROABS 3,802  HGB 14.8  HCT 46.7  MCV 82.9  PLT 148   Lab Results  Component Value Date   TSH 1.63 11/13/2022   Lab Results  Component Value Date   HGBA1C 12.0 (H) 12/02/2022   Lab Results  Component Value Date   CHOL 140 11/13/2022   HDL 69 11/13/2022   LDLCALC 55 11/13/2022   TRIG 80 11/13/2022   CHOLHDL 2.0 11/13/2022    Significant Diagnostic Results in last 30 days:  No results found.  Assessment/Plan  1. Diabetes mellitus due to underlying condition with unspecified complications (HCC) Lab Results  Component Value Date   HGBA1C 12.0 (H) 12/02/2022  - did not bring CBG log  - continue on Semaglutide  - Lipid panel; Future - TSH; Future - COMPLETE METABOLIC PANEL WITH GFR; Future - CBC with Differential/Platelet; Future - Hemoglobin A1c; Future  2. Primary hypertension B/p has improved  - continue on dietary modification and exercise  - Lipid panel; Future - TSH; Future - COMPLETE METABOLIC PANEL WITH GFR; Future - CBC with Differential/Platelet; Future - Basic metabolic panel  Family/ staff Communication: Reviewed plan of care with patient  verbalized understanding   Labs/tests ordered:  - Lipid panel; Future - TSH; Future - COMPLETE METABOLIC PANEL WITH GFR; Future - CBC with Differential/Platelet; Future - Basic metabolic panel - Hemoglobin A1c; Future  Next Appointment: Return in about 2 months (around 04/13/2023) for medical mangement of chronic issues., fasting labs prior to visit.   Caesar Bookman, NP

## 2023-02-11 LAB — BASIC METABOLIC PANEL
BUN: 14 mg/dL (ref 7–25)
CO2: 31 mmol/L (ref 20–32)
Calcium: 10.1 mg/dL (ref 8.6–10.3)
Chloride: 100 mmol/L (ref 98–110)
Creat: 1.16 mg/dL (ref 0.70–1.30)
Glucose, Bld: 126 mg/dL — ABNORMAL HIGH (ref 65–99)
Potassium: 4 mmol/L (ref 3.5–5.3)
Sodium: 138 mmol/L (ref 135–146)

## 2023-04-14 ENCOUNTER — Ambulatory Visit: Payer: No Typology Code available for payment source | Admitting: Family

## 2023-04-16 ENCOUNTER — Ambulatory Visit: Payer: No Typology Code available for payment source | Admitting: Family

## 2023-04-26 ENCOUNTER — Other Ambulatory Visit: Payer: No Typology Code available for payment source

## 2023-04-29 ENCOUNTER — Encounter: Payer: Self-pay | Admitting: Family

## 2023-04-29 ENCOUNTER — Other Ambulatory Visit: Payer: No Typology Code available for payment source

## 2023-04-29 ENCOUNTER — Ambulatory Visit: Payer: No Typology Code available for payment source | Admitting: Family

## 2023-04-29 VITALS — BP 130/74 | HR 78 | Temp 97.7°F | Resp 20 | Ht 70.0 in | Wt 235.4 lb

## 2023-04-29 DIAGNOSIS — H00012 Hordeolum externum right lower eyelid: Secondary | ICD-10-CM

## 2023-04-29 DIAGNOSIS — I10 Essential (primary) hypertension: Secondary | ICD-10-CM | POA: Diagnosis not present

## 2023-04-29 DIAGNOSIS — Z1211 Encounter for screening for malignant neoplasm of colon: Secondary | ICD-10-CM

## 2023-04-29 DIAGNOSIS — F321 Major depressive disorder, single episode, moderate: Secondary | ICD-10-CM

## 2023-04-29 DIAGNOSIS — Z23 Encounter for immunization: Secondary | ICD-10-CM | POA: Diagnosis not present

## 2023-04-29 DIAGNOSIS — F411 Generalized anxiety disorder: Secondary | ICD-10-CM

## 2023-04-29 DIAGNOSIS — E088 Diabetes mellitus due to underlying condition with unspecified complications: Secondary | ICD-10-CM

## 2023-04-29 NOTE — Progress Notes (Signed)
 Provider: Richarda Blade FNP-C   Justin Cantu, Justin Citrin, NP  Patient Care Team: Justin Cantu, Justin Citrin, NP as PCP - General (Family Medicine) Illene Labrador, OD (Optometry)  Extended Emergency Contact Information Primary Emergency Contact: Embry,William Address: [address]          [place], [place] [address] [place] Home Phone: [phone] Relation: Father  Code Status:  Full Code  Goals of care: Advanced Directive information    04/29/2023   10:05 AM  Advanced Directives  Does Patient Have a Medical Advance Directive? Yes  Type of Estate agent of [place];Living will  Does patient want to make changes to medical advance directive? No - Patient declined  Copy of Healthcare Power of Attorney in Chart? Yes - validated most recent copy scanned in chart (See row information)     Chief Complaint  Patient presents with   Medical Management of Chronic Issues    2 month follow up and discuss colonoscopy,tdap,shingles,covid ,and pneumococcal vaccines.    Discussed the use of AI scribe software for clinical note transcription with the patient, who gave verbal consent to proceed.  History of Present Illness   Justin Cantu is a 53 year old male with Type 2 diabetes, major Depression, Generalized anxiety disorder and hypertension who presents for a follow-up visit.  He is managing his diabetes with glipizide 10 mg and Rybelsus 7 mg. Although he is not regularly checking his blood sugars, he reports good control. He has been exercising and walking, and trying to eat healthier, including more fruits, except during a recent cruise. Despite these efforts, his weight has increased from 225 to 235 pounds, which he attributes to wearing layers and indulging on the cruise. No new side effects from his diabetes medications are reported.  For hypertension, he is taking losartan, hydrochlorothiazide 25 mg, and amlodipine 10 mg. He denies any headache,  dizziness, chest pain, palpitation or shortness of breath  He is on Wellbutrin 300 mg and Prozac 20 mg for depression. He experiences a 'slump' in mood after visits with his daughter, who lives in [place], and is following up with a therapist at the [place] [facility]. He mentions that his daughter may also be dealing with depression since her mother moved out, and she is trying to find a counselor she is comfortable with.  He reports having a stye on his right lower eyelid and another on the eyelid itself, with the one on the bottom being larger. It occasionally becomes inflamed but has no drainage.  He takes baby aspirin 81 mg daily and is due for a tetanus vaccine.He will get his Tdap at the pharmacy. No issues with bowel movements or urination. He has trouble sleeping, sometimes using melatonin or audio aids to help.    Past Medical History:  Diagnosis Date   Anxiety    Depression    Diabetes mellitus without complication (HCC)    Headache    Herpes simplex    Past Surgical History:  Procedure Laterality Date   NO PAST SURGERIES      Allergies  Allergen Reactions   Bee Venom Anaphylaxis    And fire ants    Allergies as of 04/29/2023       Reactions   Bee Venom Anaphylaxis   And fire ants        Medication List        Accurate as of April 29, 2023  9:04 PM. If you have any questions, ask your nurse  or doctor.          ALPRAZolam 0.5 MG tablet Commonly known as: XANAX Take 1 tablet (0.5 mg total) by mouth 2 (two) times daily as needed for anxiety.   amLODipine 10 MG tablet Commonly known as: NORVASC Take 1 tablet (10 mg total) by mouth daily.   aspirin EC 81 MG tablet Take 1 tablet (81 mg total) by mouth daily.   Blood Glucose Monitoring Suppl Devi 1 each by Does not apply route in the morning, at noon, and at bedtime. May substitute to any manufacturer covered by patient's insurance.   buPROPion 300 MG 24 hr tablet Commonly known as: WELLBUTRIN XL Take 1 tablet  (300 mg total) by mouth daily.   EPINEPHrine 0.3 mg/0.3 mL Soaj injection Commonly known as: EPI-PEN Inject 0.3 mg into the muscle See admin instructions.   FLUoxetine 20 MG capsule Commonly known as: PROZAC Take 20 mg by mouth daily.   FreeStyle Libre 3 Sensor Misc 1 Device by Does not apply route every 14 (fourteen) days. Place 1 sensor on the skin every 14 days. Use to check glucose continuously   glipiZIDE 10 MG 24 hr tablet Commonly known as: GLUCOTROL XL Take 1 tablet (10 mg total) by mouth daily.   hydrochlorothiazide 25 MG tablet Commonly known as: HYDRODIURIL Take 1 tablet (25 mg total) by mouth daily.   losartan 100 MG tablet Commonly known as: COZAAR Take 1 tablet (100 mg total) by mouth daily.   MULTIVITAMIN PO Take 1 tablet by mouth daily.   Rybelsus 7 MG Tabs Generic drug: Semaglutide TAKE 1 TABLET (7 MG TOTAL) BY MOUTH DAILY        Review of Systems  Immunization History  Administered Date(s) Administered   DTaP 12/10/1972, 02/08/1973, 04/12/1973, 03/28/1974   Fluad Trivalent(High Dose 65+) 12/02/2022   Hepatitis B, PED/ADOLESCENT 06/18/2003, 07/18/2003, 12/25/2003   Influenza Split 08/30/1990, 02/19/2012, 12/20/2014   Influenza,inj,Quad PF,6+ Mos 01/08/2016, 01/15/2017, 02/18/2018, 01/20/2019, 02/09/2020   Influenza,inj,quad, With Preservative 12/15/2013   MMR 11/01/1973, 12/07/1975   Meningococcal polysaccharide vaccine (MPSV4) 08/30/1990   OPV 12/10/1972, 02/08/1973, 04/12/1973, 03/28/1974, 08/30/1990   PFIZER(Purple Top)SARS-COV-2 Vaccination 06/02/2019, 06/27/2019   PNEUMOCOCCAL CONJUGATE-20 04/29/2023   PPD Test 08/30/1990, 06/18/2003   Pneumococcal Polysaccharide-23 07/19/2013   Td (Adult) 08/30/1990   Tdap 02/19/2012   Pertinent  Health Maintenance Due  Topic Date Due   Colonoscopy  Never done   HEMOGLOBIN A1C  06/01/2023   OPHTHALMOLOGY EXAM  08/29/2023   FOOT EXAM  12/30/2023   INFLUENZA VACCINE  Completed      12/30/2022     9:50 AM 01/13/2023    2:28 PM 01/21/2023   10:24 AM 04/29/2023   10:02 AM 04/29/2023   10:04 AM  Fall Risk  Falls in the past year? 0 0 0 0 1  Was there an injury with Fall? 0 0 0 0 0  Fall Risk Category Calculator 0 0 0 0 1  Patient at Risk for Falls Due to No Fall Risks No Fall Risks No Fall Risks    Fall risk Follow up Falls evaluation completed  Falls evaluation completed     Functional Status Survey:    Vitals:   04/29/23 1007  BP: 130/74  Pulse: 78  Resp: 20  Temp: 97.7 F (36.5 C)  SpO2: 98%  Weight: 235 lb 6.4 oz (106.8 kg)  Height: 5\' 10"  (1.778 m)   Body mass index is 33.78 kg/m. Physical Exam Vitals reviewed.  Constitutional:  General: He is not in acute distress.    Appearance: Normal appearance. He is obese. He is not ill-appearing or diaphoretic.  HENT:     Head: Normocephalic.     Right Ear: Tympanic membrane, ear canal and external ear normal. There is no impacted cerumen.     Left Ear: Tympanic membrane, ear canal and external ear normal. There is no impacted cerumen.     Nose: Nose normal. No congestion or rhinorrhea.     Mouth/Throat:     Mouth: Mucous membranes are moist.     Pharynx: Oropharynx is clear. No oropharyngeal exudate or posterior oropharyngeal erythema.  Eyes:     General: No scleral icterus.       Right eye: Hordeolum present. No discharge.        Left eye: No discharge.     Extraocular Movements: Extraocular movements intact.     Conjunctiva/sclera: Conjunctivae normal.     Pupils: Pupils are equal, round, and reactive to light.     Comments: Right lower eyelid with large bump, pea-sized, no drainage.   Neck:     Vascular: No carotid bruit.  Cardiovascular:     Rate and Rhythm: Normal rate and regular rhythm.     Pulses: Normal pulses.     Heart sounds: Normal heart sounds. No murmur heard.    No friction rub. No gallop.  Pulmonary:     Effort: Pulmonary effort is normal. No respiratory distress.     Breath sounds: Normal  breath sounds. No wheezing, rhonchi or rales.  Chest:     Chest wall: No tenderness.  Abdominal:     General: Bowel sounds are normal. There is no distension.     Palpations: Abdomen is soft. There is no mass.     Tenderness: There is no abdominal tenderness. There is no right CVA tenderness, left CVA tenderness, guarding or rebound.  Musculoskeletal:        General: No swelling or tenderness. Normal range of motion.     Cervical back: Normal range of motion. No rigidity or tenderness.     Right lower leg: No edema.     Left lower leg: No edema.  Lymphadenopathy:     Cervical: No cervical adenopathy.  Skin:    General: Skin is warm and dry.     Coloration: Skin is not pale.     Findings: No bruising, erythema, lesion or rash.  Neurological:     Mental Status: He is alert and oriented to person, place, and time.     Cranial Nerves: No cranial nerve deficit.     Sensory: No sensory deficit.     Motor: No weakness.     Coordination: Coordination normal.     Gait: Gait normal.  Psychiatric:        Mood and Affect: Mood normal.        Speech: Speech normal.        Behavior: Behavior normal.        Thought Content: Thought content normal.        Judgment: Judgment normal.      Labs reviewed: Recent Labs    11/13/22 1111 02/10/23 1107  NA 134* 138  K 4.1 4.0  CL 98 100  CO2 27 31  GLUCOSE 294* 126*  BUN 7 14  CREATININE 0.82 1.16  CALCIUM 9.2 10.1   Recent Labs    11/13/22 1111  AST 17  ALT 37  BILITOT 0.4  PROT 7.9   Recent  Labs    11/13/22 1111 04/29/23 1050  WBC 6.4 6.6  NEUTROABS 3,802 4,019  HGB 14.8 13.4  HCT 46.7 41.2  MCV 82.9 83.1  PLT 148 171   Lab Results  Component Value Date   TSH 1.63 11/13/2022   Lab Results  Component Value Date   HGBA1C 12.0 (H) 12/02/2022   Lab Results  Component Value Date   CHOL 140 11/13/2022   HDL 69 11/13/2022   LDLCALC 55 11/13/2022   TRIG 80 11/13/2022   CHOLHDL 2.0 11/13/2022    Significant  Diagnostic Results in last 30 days:  No results found.  Assessment/Plan  Type 2 Diabetes Mellitus Type 2 Diabetes Mellitus, managed with glipizide 10 mg and Rybelsus 7 mg. Reports not checking blood sugars regularly but has been exercising and attempting to eat healthier. Weight increased from 225 lbs to 235 lbs, likely due to recent cruise and wearing layers. No new side effects from Rybelsus. Discussed importance of regular blood sugar monitoring and adherence to diet and exercise. Consider increasing Rybelsus dose if lab results indicate high blood sugar. - Continue glipizide 10 mg - Continue Rybelsus 7 mg - Order lab work to assess current blood sugar levels - Encourage regular blood sugar monitoring - Advise on diet and exercise to manage weight - annual eye exam up to date  - Pneumococcal conjugate vaccine 20-valent (Prevnar 20)  Hypertension Hypertension, managed with losartan, hydrochlorothiazide 25 mg, and amlodipine 10 mg. No new issues reported. - Continue losartan - Continue hydrochlorothiazide 25 mg - Continue amlodipine 10 mg  Current moderate episode of major depressive disorder, unspecified whether recurrent  Depression, managed with Wellbutrin 300 mg and Prozac 20 mg. Reports intermittent symptoms, particularly after seeing his daughter. Following up with therapist at [facility] center. Discussed importance of ongoing therapy and adjusting expectations to cope with current circumstances. - Continue Wellbutrin 300 mg - Continue Prozac 20 mg - Encourage ongoing therapy sessions   Hordeolum externum of right lower eyelid Stye on right lower eyelid and another smaller one on the eyelid itself. No drainage reported, but occasional inflammation. Referral to ophthalmologist for evaluation and potential treatment discussed. - Refer to ophthalmologist for evaluation and potential treatment - Ambulatory referral to Ophthalmology  Generalized anxiety disorder Stable  - continue on  Alprazolam ,Prozac and Wellbutrin  - continue to follow up with Psychiatrist and Therapist at the [facility]   Colon cancer screening Asymptomatic  - Ambulatory referral to Gastroenterology  Need for Pneumococcal vaccine  Afebrile  - Pneumococcal conjugate vaccine 20-valent (Prevnar 20)   General Health Maintenance Due for tetanus and pneumonia vaccines. Discussed importance of pneumonia vaccine for individuals over 50. He has not had shingles or chickenpox. Discussed soreness as a common side effect of pneumonia vaccine and need for tetanus vaccine at pharmacy. - Administer pneumonia vaccine today - Advise to get tetanus vaccine at pharmacy - Discuss shingles vaccine in future visits  Follow-up - Schedule next appointment in 6 months - Consider increasing Rybelsus dose if lab results indicate high blood sugar - Follow up if no call from ophthalmologist within 2 weeks.   Family/ staff Communication: Reviewed plan of care with patient verbalized understanding   Labs/tests ordered: None   Next Appointment : Return in about 6 months (around 10/27/2023) for medical mangement of chronic issues.Marland Kitchen   Spent 30 minutes of Face to face and non-face to face with patient  >50% time spent counseling; reviewing medical record; tests; labs; documentation and developing future plan of  care.   Caesar Bookman, NP

## 2023-04-30 LAB — LIPID PANEL
Cholesterol: 144 mg/dL (ref <200)
HDL: 67 mg/dL (ref >=40)
LDL Cholesterol (Calc): 59 mg/dL
Non-HDL Cholesterol (Calc): 77 mg/dL (ref <130)
Total CHOL/HDL Ratio: 2.1 (calc) (ref <5.0)
Triglycerides: 98 mg/dL (ref <150)

## 2023-04-30 LAB — CBC WITH DIFFERENTIAL/PLATELET
Absolute Lymphocytes: 1980 {cells}/uL (ref 850–3900)
Absolute Monocytes: 429 {cells}/uL (ref 200–950)
Basophils Absolute: 53 {cells}/uL (ref 0–200)
Basophils Relative: 0.8 %
Eosinophils Absolute: 119 {cells}/uL (ref 15–500)
Eosinophils Relative: 1.8 %
HCT: 41.2 % (ref 38.5–50.0)
Hemoglobin: 13.4 g/dL (ref 13.2–17.1)
MCH: 27 pg (ref 27.0–33.0)
MCHC: 32.5 g/dL (ref 32.0–36.0)
MCV: 83.1 fL (ref 80.0–100.0)
MPV: 11.5 fL (ref 7.5–12.5)
Monocytes Relative: 6.5 %
Neutro Abs: 4019 {cells}/uL (ref 1500–7800)
Neutrophils Relative %: 60.9 %
Platelets: 171 10*3/uL (ref 140–400)
RBC: 4.96 10*6/uL (ref 4.20–5.80)
RDW: 13.3 % (ref 11.0–15.0)
Total Lymphocyte: 30 %
WBC: 6.6 10*3/uL (ref 3.8–10.8)

## 2023-04-30 LAB — HEMOGLOBIN A1C
Hgb A1c MFr Bld: 8.5 %{Hb} — ABNORMAL HIGH (ref <5.7)
Mean Plasma Glucose: 197 mg/dL
eAG (mmol/L): 10.9 mmol/L

## 2023-04-30 LAB — COMPLETE METABOLIC PANEL WITH GFR
AG Ratio: 1.4 (calc) (ref 1.0–2.5)
ALT: 29 U/L (ref 9–46)
AST: 19 U/L (ref 10–35)
Albumin: 4.6 g/dL (ref 3.6–5.1)
Alkaline phosphatase (APISO): 42 U/L (ref 35–144)
BUN: 10 mg/dL (ref 7–25)
CO2: 29 mmol/L (ref 20–32)
Calcium: 9.5 mg/dL (ref 8.6–10.3)
Chloride: 101 mmol/L (ref 98–110)
Creat: 1 mg/dL (ref 0.70–1.30)
Globulin: 3.4 g/dL (ref 1.9–3.7)
Glucose, Bld: 150 mg/dL — ABNORMAL HIGH (ref 65–99)
Potassium: 4 mmol/L (ref 3.5–5.3)
Sodium: 138 mmol/L (ref 135–146)
Total Bilirubin: 0.5 mg/dL (ref 0.2–1.2)
Total Protein: 8 g/dL (ref 6.1–8.1)
eGFR: 92 mL/min/{1.73_m2} (ref >=60)

## 2023-04-30 LAB — TSH: TSH: 1.73 m[IU]/L (ref 0.40–4.50)

## 2023-05-05 ENCOUNTER — Telehealth: Payer: Self-pay

## 2023-05-05 DIAGNOSIS — E088 Diabetes mellitus due to underlying condition with unspecified complications: Secondary | ICD-10-CM

## 2023-05-05 MED ORDER — RYBELSUS 14 MG PO TABS: 14.0000 mg | ORAL_TABLET | Freq: Every day | ORAL | 5 refills | Status: DC

## 2023-05-05 NOTE — Telephone Encounter (Signed)
 Discussed results with patient, patient verbalized understanding of results. Patient stated he would call back to schedule a non-fasting lab appointment for June 2025 , once he is able to check his scheduled. New RX sent to pharmacy for increased dose of rybelsus and future lab order placed.

## 2023-05-05 NOTE — Telephone Encounter (Signed)
-----   Message from Donalee Citrin Ngetich sent at 04/30/2023  4:26 PM EST ----- All labs are within normal range except glucose and Hemoglobin A1C still high but improved compared to previous level.Increase Rybelsus from 7 mg to 14 mg tablet one by mouth daily  - Recheck Hemoglobin A1C in 4 months

## 2023-05-21 ENCOUNTER — Other Ambulatory Visit: Payer: Self-pay | Admitting: Family

## 2023-05-21 DIAGNOSIS — I10 Essential (primary) hypertension: Secondary | ICD-10-CM

## 2023-05-21 DIAGNOSIS — F321 Major depressive disorder, single episode, moderate: Secondary | ICD-10-CM

## 2023-05-21 DIAGNOSIS — E088 Diabetes mellitus due to underlying condition with unspecified complications: Secondary | ICD-10-CM

## 2023-05-21 NOTE — Telephone Encounter (Signed)
 Warning label on losartan unable to refill.

## 2023-06-24 ENCOUNTER — Other Ambulatory Visit: Payer: Self-pay | Admitting: Family

## 2023-06-24 DIAGNOSIS — F321 Major depressive disorder, single episode, moderate: Secondary | ICD-10-CM

## 2023-06-24 DIAGNOSIS — E088 Diabetes mellitus due to underlying condition with unspecified complications: Secondary | ICD-10-CM

## 2023-06-24 DIAGNOSIS — I10 Essential (primary) hypertension: Secondary | ICD-10-CM

## 2023-07-05 ENCOUNTER — Other Ambulatory Visit: Payer: Self-pay | Admitting: Family

## 2023-07-05 DIAGNOSIS — N529 Male erectile dysfunction, unspecified: Secondary | ICD-10-CM

## 2023-07-05 MED ORDER — VARDENAFIL HCL 20 MG PO TABS: 20.0000 mg | ORAL_TABLET | ORAL | 1 refills | Status: AC | PRN

## 2023-07-05 NOTE — Telephone Encounter (Signed)
Message routed to PCP Ngetich, Dinah C, NP  

## 2023-09-15 ENCOUNTER — Ambulatory Visit: Admitting: Family

## 2023-09-16 ENCOUNTER — Ambulatory Visit (INDEPENDENT_AMBULATORY_CARE_PROVIDER_SITE_OTHER): Admitting: Adult Health

## 2023-09-16 ENCOUNTER — Encounter: Payer: Self-pay | Admitting: Adult Health

## 2023-09-16 ENCOUNTER — Other Ambulatory Visit: Payer: Self-pay | Admitting: Family

## 2023-09-16 VITALS — BP 138/82 | HR 79 | Temp 97.6°F | Resp 20 | Ht 70.0 in | Wt 225.0 lb

## 2023-09-16 DIAGNOSIS — F411 Generalized anxiety disorder: Secondary | ICD-10-CM

## 2023-09-16 DIAGNOSIS — N528 Other male erectile dysfunction: Secondary | ICD-10-CM

## 2023-09-16 DIAGNOSIS — E088 Diabetes mellitus due to underlying condition with unspecified complications: Secondary | ICD-10-CM | POA: Diagnosis not present

## 2023-09-16 DIAGNOSIS — N529 Male erectile dysfunction, unspecified: Secondary | ICD-10-CM | POA: Insufficient documentation

## 2023-09-16 NOTE — Progress Notes (Signed)
 Location:  [facility]   Place of Service:   psc clinic    CODE STATUS:   Allergies  Allergen Reactions   Bee Venom Anaphylaxis    And fire ants    Chief Complaint  Patient presents with   Form Completion    Renew prescriptions.    HPI:  His employer is wanting to him to work nights. This is causing him 10/10 anxiety; the anxiety is interfering with the quality of life.  Blood pressure reading today is stable. He continues to have issues with erectile dysfunction. The last medication has been tried and was not successful. He will need a referral to urology for further interventions.   Past Medical History:  Diagnosis Date   Anxiety    Depression    Diabetes mellitus without complication (HCC)    Headache    Herpes simplex     Past Surgical History:  Procedure Laterality Date   NO PAST SURGERIES      Social History   Socioeconomic History   Marital status: Single    Spouse name: Not on file   Number of children: Not on file   Years of education: Not on file   Highest education level: Associate degree: academic program  Occupational History   Not on file  Tobacco Use   Smoking status: Never   Smokeless tobacco: Never  Substance and Sexual Activity   Alcohol use: Yes    Alcohol/week: 0.0 standard drinks of alcohol    Comment: 1-2 drinks a week   Drug use: No   Sexual activity: Yes    Partners: Female  Other Topics Concern   Not on file  Social History Narrative   Not on file   Social Drivers of Health   Financial Resource Strain: Low Risk  (11/13/2022)   Overall Financial Resource Strain (CARDIA)    Difficulty of Paying Living Expenses: Not very hard  Food Insecurity: No Food Insecurity (11/13/2022)   Hunger Vital Sign    Worried About Running Out of Food in the Last Year: Never true    Ran Out of Food in the Last Year: Never true  Transportation Needs: No Transportation Needs (11/13/2022)   PRAPARE - Administrator, Civil Service  (Medical): No    Lack of Transportation (Non-Medical): No  Physical Activity: Insufficiently Active (11/13/2022)   Exercise Vital Sign    Days of Exercise per Week: 2 days    Minutes of Exercise per Session: 10 min  Stress: Stress Concern Present (11/13/2022)   [employer] of Occupational Health - Occupational Stress Questionnaire    Feeling of Stress : Rather much  Social Connections: Moderately Integrated (11/13/2022)   Social Connection and Isolation Panel    Frequency of Communication with Friends and Family: Twice a week    Frequency of Social Gatherings with Friends and Family: Once a week    Attends Religious Services: More than 4 times per year    Active Member of [employer] or Organizations: Yes    Attends Engineer, structural: More than 4 times per year    Marital Status: Divorced  Catering manager Violence: Not on file   Family History  Problem Relation Age of Onset   Hypertension Mother    Diabetes Mother    Heart disease Mother    Hypertension Father       VITAL SIGNS BP 138/82   Pulse 79   Temp 97.6 F (36.4 C)   Resp 20  Ht 5' 10 (1.778 m)   Wt 225 lb (102.1 kg)   SpO2 96%   BMI 32.28 kg/m   Outpatient Encounter Medications as of 09/16/2023  Medication Sig   ALPRAZolam  (XANAX ) 0.5 MG tablet Take 1 tablet (0.5 mg total) by mouth 2 (two) times daily as needed for anxiety.   amLODipine  (NORVASC ) 10 MG tablet TAKE 1 TABLET BY MOUTH EVERY DAY   aspirin  EC 81 MG tablet Take 1 tablet (81 mg total) by mouth daily.   buPROPion  (WELLBUTRIN  XL) 300 MG 24 hr tablet TAKE 1 TABLET BY MOUTH EVERY DAY   EPINEPHrine  0.3 mg/0.3 mL IJ SOAJ injection Inject 0.3 mg into the muscle See admin instructions.   FLUoxetine (PROZAC) 20 MG capsule Take 20 mg by mouth daily.   glipiZIDE  (GLUCOTROL  XL) 10 MG 24 hr tablet TAKE 1 TABLET BY MOUTH EVERY DAY   hydrochlorothiazide  (HYDRODIURIL ) 25 MG tablet Take 1 tablet (25 mg total) by mouth daily.   losartan  (COZAAR ) 100 MG  tablet TAKE 1 TABLET BY MOUTH EVERY DAY   Multiple Vitamins-Minerals (MULTIVITAMIN PO) Take 1 tablet by mouth daily.    Semaglutide  (RYBELSUS ) 14 MG TABS Take 1 tablet (14 mg total) by mouth daily.   vardenafil  (LEVITRA ) 20 MG tablet Take 1 tablet (20 mg total) by mouth as needed for erectile dysfunction.   Blood Glucose Monitoring Suppl DEVI 1 each by Does not apply route in the morning, at noon, and at bedtime. May substitute to any manufacturer covered by patient's insurance. (Patient not taking: Reported on 09/16/2023)   Continuous Glucose Sensor (FREESTYLE LIBRE 3 SENSOR) MISC 1 Device by Does not apply route every 14 (fourteen) days. Place 1 sensor on the skin every 14 days. Use to check glucose continuously (Patient not taking: Reported on 09/16/2023)   No facility-administered encounter medications on file as of 09/16/2023.     SIGNIFICANT DIAGNOSTIC EXAMS  Review of Systems  Constitutional:  Negative for malaise/fatigue.  Respiratory:  Negative for cough and shortness of breath.   Cardiovascular:  Negative for chest pain, palpitations and leg swelling.  Gastrointestinal:  Negative for abdominal pain, constipation and heartburn.  Genitourinary:        Has erectile dysfunction  Musculoskeletal:  Negative for back pain, joint pain and myalgias.  Skin: Negative.   Neurological:  Negative for dizziness.  Psychiatric/Behavioral:  The patient is nervous/anxious.     Physical Exam Constitutional:      General: He is not in acute distress.    Appearance: He is well-developed. He is not diaphoretic.  Neck:     Thyroid: No thyromegaly.  Cardiovascular:     Rate and Rhythm: Normal rate and regular rhythm.     Heart sounds: Normal heart sounds.  Pulmonary:     Effort: Pulmonary effort is normal. No respiratory distress.     Breath sounds: Normal breath sounds.  Abdominal:     General: Bowel sounds are normal. There is no distension.     Palpations: Abdomen is soft.     Tenderness:  There is no abdominal tenderness.  Musculoskeletal:        General: Normal range of motion.     Cervical back: Neck supple.     Right lower leg: No edema.     Left lower leg: No edema.  Lymphadenopathy:     Cervical: No cervical adenopathy.  Skin:    General: Skin is warm and dry.  Neurological:     Mental Status: He is alert and  oriented to person, place, and time.  Psychiatric:        Mood and Affect: Mood normal.      ASSESSMENT/ PLAN:  TODAY  Erectile dysfunction; will stop levetra at this time; this medication is ineffective. He has tried viagra and cilias in the past. Will refer to urology  Generalized anxiety disorder: at this time he has one refill left on his prescription. He will need to use this prior to getting a refill   Barnie Seip NP [facility]   call [phone]

## 2023-09-16 NOTE — Telephone Encounter (Signed)
 Patient is requesting a refill of the following medications: Requested Prescriptions   Pending Prescriptions Disp Refills   ALPRAZolam  (XANAX ) 0.5 MG tablet [Pharmacy Med Name: ALPRAZOLAM  0.5 MG TABLET] 60 tablet     Sig: TAKE 1 TABLET BY MOUTH 2 TIMES DAILY AS NEEDED FOR ANXIETY.    Date of last refill: 12/02/2022  Refill amount: 3  Treatment agreement date: 11/13/2022

## 2023-09-17 ENCOUNTER — Ambulatory Visit: Payer: Self-pay

## 2023-09-17 LAB — HEMOGLOBIN A1C
Hgb A1c MFr Bld: 8.2 % — ABNORMAL HIGH (ref <5.7)
Mean Plasma Glucose: 189 mg/dL
eAG (mmol/L): 10.4 mmol/L

## 2023-10-19 ENCOUNTER — Encounter: Payer: Self-pay | Admitting: Urology

## 2023-10-19 ENCOUNTER — Ambulatory Visit (INDEPENDENT_AMBULATORY_CARE_PROVIDER_SITE_OTHER): Admitting: Urology

## 2023-10-19 ENCOUNTER — Ambulatory Visit: Payer: No Typology Code available for payment source | Admitting: Family

## 2023-10-19 ENCOUNTER — Encounter: Payer: Self-pay | Admitting: Family

## 2023-10-19 VITALS — BP 134/82 | HR 77 | Temp 97.9°F | Resp 20 | Ht 70.0 in | Wt 228.0 lb

## 2023-10-19 VITALS — BP 127/82 | HR 82 | Ht 70.0 in | Wt 225.0 lb

## 2023-10-19 DIAGNOSIS — N5201 Erectile dysfunction due to arterial insufficiency: Secondary | ICD-10-CM | POA: Diagnosis not present

## 2023-10-19 DIAGNOSIS — F321 Major depressive disorder, single episode, moderate: Secondary | ICD-10-CM | POA: Diagnosis not present

## 2023-10-19 DIAGNOSIS — F411 Generalized anxiety disorder: Secondary | ICD-10-CM | POA: Diagnosis not present

## 2023-10-19 DIAGNOSIS — E66811 Obesity, class 1: Secondary | ICD-10-CM

## 2023-10-19 DIAGNOSIS — N5089 Other specified disorders of the male genital organs: Secondary | ICD-10-CM | POA: Diagnosis not present

## 2023-10-19 DIAGNOSIS — I10 Essential (primary) hypertension: Secondary | ICD-10-CM

## 2023-10-19 DIAGNOSIS — N529 Male erectile dysfunction, unspecified: Secondary | ICD-10-CM

## 2023-10-19 DIAGNOSIS — E6609 Other obesity due to excess calories: Secondary | ICD-10-CM

## 2023-10-19 DIAGNOSIS — E1165 Type 2 diabetes mellitus with hyperglycemia: Secondary | ICD-10-CM

## 2023-10-19 DIAGNOSIS — K5901 Slow transit constipation: Secondary | ICD-10-CM

## 2023-10-19 DIAGNOSIS — Z6832 Body mass index (BMI) 32.0-32.9, adult: Secondary | ICD-10-CM

## 2023-10-19 DIAGNOSIS — J302 Other seasonal allergic rhinitis: Secondary | ICD-10-CM

## 2023-10-19 MED ORDER — TADALAFIL 20 MG PO TABS: ORAL_TABLET | ORAL | 0 refills | Status: DC

## 2023-10-19 MED ORDER — AMLODIPINE BESYLATE 10 MG PO TABS: 10.0000 mg | ORAL_TABLET | Freq: Every day | ORAL | 1 refills | Status: AC

## 2023-10-19 NOTE — Progress Notes (Signed)
 10/19/2023 12:58 PM   Justin Cantu [birth year] [id number]  Referring provider: Landy Barnie RAMAN, NP [address] [place],  [place] [address]  Chief Complaint  Patient presents with   Erectile Dysfunction   New Patient (Initial Visit)   Establish Care    HPI: Justin Cantu is a 53 y.o. male referred for evaluation of erectile dysfunction.  He also discovered a left scrotal mass on self-exam last week that he wanted to have checked  5 year history of erectile dysfunction.  Currently complains of difficulty achieving and maintaining an erection.  His erections are firm enough for penetration much less than 50% of the time.  The times he is able to achieve penetration he is able to maintain the erection much less than 50% of the time. SHIM today was 9/25 indicating moderate ED Has been on sildenafil and tadalafil  in the past.  These medications worked initially but then became ineffective.  Presently on vardenafil  20 mg which has not been effective Denies tiredness, fatigue or decreased libido Scrotal mass is not painful.  History of vasectomy 16 years ago   PMH: Past Medical History:  Diagnosis Date   Anxiety    Depression    Diabetes mellitus without complication (HCC)    Headache    Herpes simplex     Surgical History: Past Surgical History:  Procedure Laterality Date   NO PAST SURGERIES      Home Medications:  Allergies as of 10/19/2023       Reactions   Bee Venom Anaphylaxis   And fire ants        Medication List        Accurate as of October 19, 2023 12:58 PM. If you have any questions, ask your nurse or doctor.          STOP taking these medications    Blood Glucose Monitoring Suppl Devi Stopped by: Justin Cantu   FreeStyle [place] 3 Sensor Misc Stopped by: Justin Cantu       TAKE these medications    ALPRAZolam  0.5 MG tablet Commonly known as: XANAX  TAKE 1 TABLET BY MOUTH 2 TIMES DAILY AS NEEDED FOR ANXIETY.   amLODipine  10 MG  tablet Commonly known as: NORVASC  Take 1 tablet (10 mg total) by mouth daily.   aspirin  EC 81 MG tablet Take 1 tablet (81 mg total) by mouth daily.   buPROPion  300 MG 24 hr tablet Commonly known as: WELLBUTRIN  XL TAKE 1 TABLET BY MOUTH EVERY DAY   EPINEPHrine  0.3 mg/0.3 mL Soaj injection Commonly known as: EPI-PEN Inject 0.3 mg into the muscle See admin instructions.   FLUoxetine 20 MG capsule Commonly known as: PROZAC Take 20 mg by mouth daily.   glipiZIDE  10 MG 24 hr tablet Commonly known as: GLUCOTROL  XL TAKE 1 TABLET BY MOUTH EVERY DAY   hydrochlorothiazide  25 MG tablet Commonly known as: HYDRODIURIL  Take 1 tablet (25 mg total) by mouth daily.   losartan  100 MG tablet Commonly known as: COZAAR  TAKE 1 TABLET BY MOUTH EVERY DAY   MULTIVITAMIN PO Take 1 tablet by mouth daily.   Rybelsus  14 MG Tabs Generic drug: Semaglutide  Take 1 tablet (14 mg total) by mouth daily.   tadalafil  20 MG tablet Commonly known as: CIALIS  Take one tab  by mouth one hour prior to intercourse Started by: Justin Cantu   vardenafil  20 MG tablet Commonly known as: Levitra  Take 1 tablet (20 mg total) by mouth as needed for erectile dysfunction.  Allergies:  Allergies  Allergen Reactions   Bee Venom Anaphylaxis    And fire ants    Family History: Family History  Problem Relation Age of Onset   Hypertension Mother    Diabetes Mother    Heart disease Mother    Hypertension Father     Social History:  reports that he has never smoked. He has never used smokeless tobacco. He reports current alcohol use. He reports that he does not use drugs.   Physical Exam: BP 127/82 (BP Location: Left Arm, Patient Position: Sitting, Cuff Size: Normal)   Pulse 82   Ht 5' 10 (1.778 m)   Wt 225 lb (102.1 kg)   SpO2 98%   BMI 32.28 kg/m   Constitutional:  Alert and oriented, No acute distress. HEENT: [place] AT Respiratory: Normal respiratory effort, no increased work of  breathing. GU: Phallus without lesions.  Testes descended bilaterally without masses or tenderness.  Prominent globus minor left epididymis Psychiatric: Normal mood and affect.   Assessment & Plan:    1. Erectile dysfunction PDE 5 inhibitor refractory Second line options were discussed including intracavernosal injections and vacuum erection devices.  He is not interested in intracavernosal injections.  He was provided literature on vacuum erection devices Penile implant surgery was also discussed and he was provided literature He asked about titrating vardenafil  to a higher dose.  I do not have experience on titrating this medication above 20 mg however have had improvement in several patients on titrating tadalafil  to 40 mg He would like further medical management and Rx tadalafil  20 mg sent to pharmacy.  He will take 2 tabs 1 hour prior to intercourse  2.  Scrotal mass Exam consistent with post vasectomy epididymal changes Scrotal sonogram was scheduled   Justin JAYSON Barba, MD  [facility] [address], [address] [place], [place] [address] [phone]

## 2023-10-20 ENCOUNTER — Other Ambulatory Visit: Payer: Self-pay | Admitting: Family

## 2023-10-20 DIAGNOSIS — T7840XS Allergy, unspecified, sequela: Secondary | ICD-10-CM

## 2023-10-20 LAB — PSA: Prostate Specific Ag, Serum: 0.7 ng/mL (ref 0.0–4.0)

## 2023-10-20 LAB — TESTOSTERONE: Testosterone: 334 ng/dL (ref 264–916)

## 2023-10-20 NOTE — Telephone Encounter (Signed)
Message routed to PCP Ngetich, Dinah C, NP  

## 2023-10-21 DIAGNOSIS — E1165 Type 2 diabetes mellitus with hyperglycemia: Secondary | ICD-10-CM | POA: Insufficient documentation

## 2023-10-21 DIAGNOSIS — F321 Major depressive disorder, single episode, moderate: Secondary | ICD-10-CM | POA: Insufficient documentation

## 2023-10-21 DIAGNOSIS — E6609 Other obesity due to excess calories: Secondary | ICD-10-CM | POA: Insufficient documentation

## 2023-10-21 NOTE — Progress Notes (Signed)
 Provider: Roxan Plough FNP-C   Adi Doro, Roxan BROCKS, NP  Patient Care Team: Alverto [name], Roxan BROCKS, NP as PCP - General (Family Medicine) Claudene Muskrat, OD (Optometry)  Extended Emergency Contact Information Primary Emergency Contact: Bellavance,William Address: [address]          [place], [place] [address] [place] Home Phone: [phone] Relation: Father  Code Status:  Full Code  Goals of care: Advanced Directive information    09/16/2023    8:31 AM  Advanced Directives  Does Patient Have a Medical Advance Directive? Yes  Type of Advance Directive Healthcare Power of Attorney  Does patient want to make changes to medical advance directive? No - Patient declined  Copy of Healthcare Power of Attorney in Chart? Yes - validated most recent copy scanned in chart (See row information)     Chief Complaint  Patient presents with   Medical Management of Chronic Issues    6 Month follow up    Discussed the use of AI scribe software for clinical note transcription with the patient, who gave verbal consent to proceed.  History of Present Illness   Justin Cantu is a 53 year old male with diabetes, anxiety, and hypertension who presents for a six-month follow-up visit.  The patient reports a history of diabetes, anxiety, and hypertension. His A1c has improved from 8.5% five months ago to 8.2% recently. He is making efforts to manage his diet and exercise, although he experiences moments of depression that affect his eating habits. He walks three to four days a week, accumulating more than half an hour of walking each day, though not continuously.  He is currently taking several medications including a multivitamin, Prozac 20 mg daily for anxiety, aspirin  1 mg daily, hydrochlorothiazide  25 mg for blood pressure, Rybalzi 14 mg daily, Wellbutrin  300 mg daily, amlodipine  10 mg for blood pressure, glipizide  10 mg, losartan  100 mg daily for blood pressure, Levitra  20 mg as  needed, Xanax  0.5 mg twice daily as needed, and Cialis  20 mg. He reports constipation as a side effect of Rybalzi. He has not needed to use his Epipen  recently despite a couple of close calls.  He mentions seeing a urologist who prescribed a higher dose of Cialis , possibly 40 mg, and is awaiting an ultrasound. He has not experienced any recent chest pain. He reports no numbness or tingling from diabetes. He trims his own toenails and has normal calluses.  He is seeing a Veterinary surgeon for anxiety once a month at a medical center.   Past Medical History:  Diagnosis Date   Anxiety    Depression    Diabetes mellitus without complication (HCC)    Headache    Herpes simplex    Past Surgical History:  Procedure Laterality Date   NO PAST SURGERIES      Allergies  Allergen Reactions   Bee Venom Anaphylaxis    And fire ants    Allergies as of 10/19/2023       Reactions   Bee Venom Anaphylaxis   And fire ants        Medication List        Accurate as of October 19, 2023 11:59 PM. If you have any questions, ask your nurse or doctor.          STOP taking these medications    Blood Glucose Monitoring Suppl Devi Stopped by: Glendia BROCKS Barba   FreeStyle [place] 3 Sensor Misc Stopped by: Glendia BROCKS Barba  TAKE these medications    ALPRAZolam  0.5 MG tablet Commonly known as: XANAX  TAKE 1 TABLET BY MOUTH 2 TIMES DAILY AS NEEDED FOR ANXIETY.   amLODipine  10 MG tablet Commonly known as: NORVASC  Take 1 tablet (10 mg total) by mouth daily.   aspirin  EC 81 MG tablet Take 1 tablet (81 mg total) by mouth daily.   buPROPion  300 MG 24 hr tablet Commonly known as: WELLBUTRIN  XL TAKE 1 TABLET BY MOUTH EVERY DAY   EPINEPHrine  0.3 mg/0.3 mL Soaj injection Commonly known as: EPI-PEN Inject 0.3 mg into the muscle See admin instructions.   FLUoxetine 20 MG capsule Commonly known as: PROZAC Take 20 mg by mouth daily.   glipiZIDE  10 MG 24 hr tablet Commonly known as: GLUCOTROL   XL TAKE 1 TABLET BY MOUTH EVERY DAY   hydrochlorothiazide  25 MG tablet Commonly known as: HYDRODIURIL  Take 1 tablet (25 mg total) by mouth daily.   losartan  100 MG tablet Commonly known as: COZAAR  TAKE 1 TABLET BY MOUTH EVERY DAY   MULTIVITAMIN PO Take 1 tablet by mouth daily.   Rybelsus  14 MG Tabs Generic drug: Semaglutide  Take 1 tablet (14 mg total) by mouth daily.   tadalafil  20 MG tablet Commonly known as: CIALIS  Take one tab  by mouth one hour prior to intercourse Started by: Glendia JAYSON Barba   vardenafil  20 MG tablet Commonly known as: Levitra  Take 1 tablet (20 mg total) by mouth as needed for erectile dysfunction.        Review of Systems  Constitutional:  Negative for appetite change, chills, fatigue, fever and unexpected weight change.  HENT:  Negative for congestion, dental problem, ear discharge, ear pain, facial swelling, hearing loss, nosebleeds, postnasal drip, rhinorrhea, sinus pressure, sinus pain, sneezing, sore throat, tinnitus and trouble swallowing.   Eyes:  Negative for pain, discharge, redness, itching and visual disturbance.  Respiratory:  Negative for cough, chest tightness, shortness of breath and wheezing.   Cardiovascular:  Negative for chest pain, palpitations and leg swelling.  Gastrointestinal:  Positive for constipation. Negative for abdominal distention, abdominal pain, blood in stool, diarrhea, nausea and vomiting.  Endocrine: Negative for cold intolerance, heat intolerance, polydipsia, polyphagia and polyuria.  Genitourinary:  Negative for difficulty urinating, dysuria, flank pain, frequency and urgency.       ED  Musculoskeletal:  Negative for arthralgias, back pain, gait problem, joint swelling, myalgias, neck pain and neck stiffness.  Skin:  Negative for color change, pallor, rash and wound.  Neurological:  Negative for dizziness, syncope, speech difficulty, weakness, light-headedness, numbness and headaches.  Hematological:  Does not  bruise/bleed easily.  Psychiatric/Behavioral:  Negative for agitation, behavioral problems, confusion, hallucinations, self-injury, sleep disturbance and suicidal ideas. The patient is nervous/anxious.     Immunization History  Administered Date(s) Administered   DTaP 12/10/1972, 02/08/1973, 04/12/1973, 03/28/1974   Fluad Trivalent(High Dose 65+) 12/02/2022   Fluzone Influenza virus vaccine,trivalent (IIV3), split virus 02/19/2012, 12/20/2014   Hepatitis B, PED/ADOLESCENT 06/18/2003, 07/18/2003, 12/25/2003   Influenza Split 08/30/1990   Influenza,inj,Quad PF,6+ Mos 01/08/2016, 01/15/2017, 02/18/2018, 01/20/2019, 02/09/2020   Influenza,inj,quad, With Preservative 12/15/2013   MMR 11/01/1973, 12/07/1975   Meningococcal polysaccharide vaccine (MPSV4) 08/30/1990   OPV 12/10/1972, 02/08/1973, 04/12/1973, 03/28/1974, 08/30/1990   PFIZER(Purple Top)SARS-COV-2 Vaccination 06/02/2019, 06/27/2019   PNEUMOCOCCAL CONJUGATE-20 04/29/2023   PPD Test 08/30/1990, 06/18/2003   Pneumococcal Polysaccharide-23 07/19/2013   Td (Adult) 08/30/1990   Tdap 02/19/2012   Pertinent  Health Maintenance Due  Topic Date Due   Colonoscopy  Never done  INFLUENZA VACCINE  11/24/2023 (Originally 10/08/2023)   OPHTHALMOLOGY EXAM  02/08/2024 (Originally 08/29/2023)   FOOT EXAM  12/30/2023   HEMOGLOBIN A1C  03/18/2024      01/13/2023    2:28 PM 01/21/2023   10:24 AM 04/29/2023   10:02 AM 04/29/2023   10:04 AM 09/16/2023    8:29 AM  Fall Risk  Falls in the past year? 0 0 0 1 0  Was there an injury with Fall? 0 0 0 0 0  Fall Risk Category Calculator 0 0 0 1 0  Patient at Risk for Falls Due to No Fall Risks No Fall Risks   No Fall Risks  Fall risk Follow up  Falls evaluation completed   Falls evaluation completed   Functional Status Survey:    Vitals:   10/19/23 1010  BP: 134/82  Pulse: 77  Resp: 20  Temp: 97.9 F (36.6 C)  SpO2: 99%  Weight: 228 lb (103.4 kg)  Height: 5' 10 (1.778 m)   Body mass index  is 32.71 kg/m. Physical Exam VITALS: T- 97.9, P- 77, BP- 134/82, SaO2- 99% MEASUREMENTS: Weight- 228. GENERAL: Alert, cooperative, well developed, no acute distress. HEENT: Normocephalic, normal oropharynx, moist mucous membranes. Tympanic membranes normal bilaterally. Nasal mucosa without swelling. Pupils equal, round, and reactive to light. No sinus tenderness. CHEST: Clear to auscultation bilaterally. No wheezes, rhonchi, or crackles. CARDIOVASCULAR: Normal heart rate and rhythm. S1 and S2 normal without murmurs. ABDOMEN: Soft, non-tender, non-distended, without organomegaly. Normal bowel sounds. EXTREMITIES: No cyanosis or edema. NEUROLOGICAL: Cranial nerves grossly intact. Moves all extremities without gross motor or sensory deficit. Sensation intact in feet bilaterally.  SKIN: No rash,no lesion or erythema  right great toe callus  PSYCHIATRY/BEHAVIORAL: Mood stable   Labs reviewed: Recent Labs    11/13/22 1111 02/10/23 1107 04/29/23 1050  NA 134* 138 138  K 4.1 4.0 4.0  CL 98 100 101  CO2 27 31 29   GLUCOSE 294* 126* 150*  BUN 7 14 10   CREATININE 0.82 1.16 1.00  CALCIUM 9.2 10.1 9.5   Recent Labs    11/13/22 1111 04/29/23 1050  AST 17 19  ALT 37 29  BILITOT 0.4 0.5  PROT 7.9 8.0   Recent Labs    11/13/22 1111 04/29/23 1050  WBC 6.4 6.6  NEUTROABS 3,802 4,019  HGB 14.8 13.4  HCT 46.7 41.2  MCV 82.9 83.1  PLT 148 171   Lab Results  Component Value Date   TSH 1.73 04/29/2023   Lab Results  Component Value Date   HGBA1C 8.2 (H) 09/16/2023   Lab Results  Component Value Date   CHOL 144 04/29/2023   HDL 67 04/29/2023   LDLCALC 59 04/29/2023   TRIG 98 04/29/2023   CHOLHDL 2.1 04/29/2023    Significant Diagnostic Results in last 30 days:  No results found.  Assessment/Plan    Type 2 diabetes mellitus Type 2 diabetes mellitus with recent improvement in glycemic control. A1c decreased from 8.5% to 8.2% over the past five months. No reported numbness  or tingling. - Continue Rybalzi 14 mg daily - Encourage dietary modifications to reduce sugar and carbohydrate intake - Encourage regular exercise, aiming for more consistent activity - Monitor blood glucose levels at home and report if consistently above 200 mg/dL - Schedule lab work for next visit, ensuring fasting state  Hypertension Hypertension managed with hydrochlorothiazide , amlodipine , and losartan . Blood pressure today was 134/82 mmHg, indicating good control. - Continue hydrochlorothiazide  25 mg daily - Continue amlodipine   10 mg daily - Continue losartan  100 mg daily - Refill amlodipine  prescription  Obesity BMI 32.7 wih associated comorbidity HTN and T2 DM, Obesity with a slight increase in weight from 225 lbs to 228 lbs. Efforts to improve diet and increase physical activity are ongoing. - Encourage dietary modifications to reduce caloric intake - Encourage regular physical activity, aiming for more consistent exercise  Anxiety disorder and depression Anxiety disorder and depression managed with Prozac and Wellbutrin . Attends counseling sessions approximately once a month. No acute concerns reported. - Continue Prozac 20 mg daily - Continue Wellbutrin  300 mg daily - Continue attending counseling sessions monthly  Erectile dysfunction Erectile dysfunction previously managed with Cialis  and Levitra . Recently evaluated by a urologist who prescribed a higher dose of Cialis  (40 mg). Awaiting ultrasound results for further assessment. - Continue current Cialis  20 mg as needed until new prescription is confirmed - Follow up with urologist for ultrasound results and medication update  Constipation Chronic constipation reported as a side effect of Rybalzi. No other gastrointestinal symptoms reported.  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 No recent use of Epipen  reported. - Continue to carry Epipen  and use as needed  for allergic reactions  Callus of right big toe Callus on the right big toe. No signs of infection or complications. - Advise trimming toenails straight across to prevent ingrown toenails - Recommend gentle care of callus during bathing and moisturizing with lotion or Vaseline  General Health Maintenance General health maintenance discussed, including eye and dental care. No recent eye examination reported. - Schedule an eye examination - Continue regular dental check-ups   Family/ staff Communication: Reviewed plan of care with patient verbalized understanding   Labs/tests ordered: - CBC with Differential/Platelet - CMP with eGFR([facility]) - TSH - Hgb A1C - Lipid panel  Next Appointment : Return in about 6 months (around 04/20/2024) for medical mangement of chronic issues.SABRA   Spent 30 minutes of Face to face and non-face to face with patient  >50% time spent counseling; reviewing medical record; tests; labs; documentation and developing future plan of care.   Roxan JAYSON Plough, NP

## 2023-10-28 ENCOUNTER — Ambulatory Visit
Admission: RE | Admit: 2023-10-28 | Discharge: 2023-10-28 | Disposition: A | Discharge status: Home / Self Care | Source: Ambulatory Visit | Attending: Urology | Admitting: Urology

## 2023-10-28 DIAGNOSIS — N5201 Erectile dysfunction due to arterial insufficiency: Secondary | ICD-10-CM | POA: Insufficient documentation

## 2023-11-10 ENCOUNTER — Encounter: Payer: Self-pay | Admitting: Urology

## 2023-11-11 ENCOUNTER — Ambulatory Visit (INDEPENDENT_AMBULATORY_CARE_PROVIDER_SITE_OTHER): Admitting: Allergy & Immunology

## 2023-11-11 ENCOUNTER — Encounter: Payer: Self-pay | Admitting: Allergy & Immunology

## 2023-11-11 ENCOUNTER — Other Ambulatory Visit: Payer: Self-pay

## 2023-11-11 VITALS — BP 120/80 | HR 79 | Temp 97.0°F | Resp 18 | Ht 70.08 in | Wt 219.0 lb

## 2023-11-11 DIAGNOSIS — T63481D Toxic effect of venom of other arthropod, accidental (unintentional), subsequent encounter: Secondary | ICD-10-CM | POA: Diagnosis not present

## 2023-11-11 DIAGNOSIS — J302 Other seasonal allergic rhinitis: Secondary | ICD-10-CM | POA: Diagnosis not present

## 2023-11-11 MED ORDER — TRIAMCINOLONE ACETONIDE 0.1 % EX OINT: 1.0000 | TOPICAL_OINTMENT | Freq: Two times a day (BID) | CUTANEOUS | 2 refills | Status: AC
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
## 2023-11-11 NOTE — Patient Instructions (Addendum)
 1. Insect sting allergy - We will get some labs to look for a stinging insect allergy.  - We will also look for a fire any allergy. - EpiPen  is up to date. - Emergency Action Plan provided.   2. Seasonal allergic rhinitis - Because of insurance stipulations, we cannot do skin testing on the same day as your first visit. - We are all working to fight this, but for now we need to do two separate visits.  - We will know more after we do testing at the next visit.  - The skin testing visit can be squeezed in at your convenience.  - Then we can make a more full plan to address all of your symptoms. - Be sure to stop your antihistamines for 3 days before this appointment.   3. Large local reactions to mosquito bites - Add on triamcinolone  0.1% ointment twice daily for a few days to keep the inflammation under control. - This will decrease the scratching and prevent infections from occurring.   4. Return in about 1 week (around 11/18/2023) for SKIN TESTING. You can have the follow up appointment with Dr. Iva or a Nurse Practicioner (our Nurse Practitioners are excellent and always have Physician oversight!).    Please inform us  of any Emergency Department visits, hospitalizations, or changes in symptoms. Call us  before going to the ED for breathing or allergy symptoms since we might be able to fit you in for a sick visit. Feel free to contact us  anytime with any questions, problems, or concerns.  It was a pleasure to meet you today!  Websites that have reliable patient information: 1. American Academy of Asthma, Allergy, and Immunology: www.aaaai.org 2. Food Allergy Research and Education (FARE): foodallergy.org 3. Mothers of Asthmatics: http://www.asthmacommunitynetwork.org 4. American College of Allergy, Asthma, and Immunology: www.acaai.org      "Like" us  on Facebook and Instagram for our latest updates!      A healthy democracy works best when [employer] participate! Make  sure you are registered to vote! If you have moved or changed any of your contact information, you will need to get this updated before voting! Scan the QR codes below to learn more!

## 2023-11-13 ENCOUNTER — Ambulatory Visit: Payer: Self-pay | Admitting: Urology

## 2023-11-14 ENCOUNTER — Other Ambulatory Visit: Payer: Self-pay | Admitting: Family

## 2023-11-17 LAB — HYMENOPTERA VENOM ALLERGY II

## 2023-11-18 LAB — ALLERGEN FIRE ANT: I070-IgE Fire Ant (Invicta): 0.24 kU/L — AB

## 2023-11-18 LAB — HYMENOPTERA VENOM ALLERGY II
Bumblebee: <0.1 kU/L
Hornet, White Face, IgE: 0.18 kU/L — AB
Hornet, Yellow, IgE: <0.1 kU/L
I001-IgE Honeybee: <0.1 kU/L
I208-IgE Api m 1: <0.1 kU/L
I210-IgE Pol d 5: <0.1 kU/L
I211-IgE Ves v 1: <0.1 kU/L
I211-IgE Ves v 1: <0.1 kU/L
I214-IgE Api m 2: <0.1 kU/L
I215-IgE Api m 3: <0.1 kU/L
I216-IgE Api m 5: 0.25 kU/L — AB
I217-IgE Api m 10: <0.1 kU/L
Reflex Information: 0.14 kU/L — AB
Reflex Information: 0.4 kU/L — AB
Tryptase: 3.3 ug/L (ref 2.2–13.2)

## 2023-11-18 LAB — O214-IGE CCD DETERMINANTS: O214-IgE CCD Determinants: <0.1 kU/L

## 2023-11-18 LAB — ALLERGEN COMPONENT COMMENTS

## 2023-11-21 ENCOUNTER — Ambulatory Visit: Payer: Self-pay | Admitting: Allergy & Immunology

## 2023-11-23 ENCOUNTER — Ambulatory Visit: Admitting: Allergy & Immunology

## 2023-11-25 ENCOUNTER — Ambulatory Visit (INDEPENDENT_AMBULATORY_CARE_PROVIDER_SITE_OTHER): Admitting: Allergy & Immunology

## 2023-11-25 ENCOUNTER — Encounter: Payer: Self-pay | Admitting: Allergy & Immunology

## 2023-11-25 DIAGNOSIS — J3089 Other allergic rhinitis: Secondary | ICD-10-CM | POA: Diagnosis not present

## 2023-11-25 DIAGNOSIS — J302 Other seasonal allergic rhinitis: Secondary | ICD-10-CM

## 2023-11-25 MED ORDER — LEVOCETIRIZINE DIHYDROCHLORIDE 5 MG PO TABS: 5.0000 mg | ORAL_TABLET | Freq: Every evening | ORAL | 3 refills | Status: AC

## 2023-11-25 NOTE — Patient Instructions (Addendum)
 1. Insect sting allergy  - Testing was positive to fire  ants and some stinging insects. - But they were LOW and I think that skin venom testing would clarify this more. - Let's get you scheduled for December Jhonnie from [facility] will call you to schedule that). - In the meantime, keep your EpiPen  handy.   2. Seasonal allergic rhinitis - Testing today showed: grasses, ragweed, weeds, trees, indoor molds, outdoor molds, dust mites, and cat - Copy of test results provided.  - Avoidance measures provided. - Start taking: Xyzal  (levocetirizine) 5mg  tablet once daily - You can use an extra dose of the antihistamine, if needed, for breakthrough symptoms.  - Consider nasal saline rinses 1-2 times daily to remove allergens from the nasal cavities as well as help with mucous clearance (this is especially helpful to do before the nasal sprays are given) - Consider allergy  shots as a means of long-term control. - Allergy  shots re-train and reset the immune system to ignore environmental allergens and decrease the resulting immune response to those allergens (sneezing, itchy watery eyes, runny nose, nasal congestion, etc).    - Allergy  shots improve symptoms in 75-85% of patients.  - We can discuss more at the next appointment if the medications are not working for you. - But your environmental allergens are likely the least of your concerns at this point!   3. Large local reactions to mosquito bites - Continue with triamcinolone  0.1% ointment twice daily for a few days to keep the inflammation under control. - This will decrease the scratching and prevent infections from occurring.   4. Return in about 3 months (around 02/24/2024). You can have the follow up appointment with Dr. Iva or a Nurse Practicioner (our Nurse Practitioners are excellent and always have Physician oversight!).    Please inform us  of any Emergency Department visits, hospitalizations, or changes in symptoms. Call us   before going to the ED for breathing or allergy  symptoms since we might be able to fit you in for a sick visit. Feel free to contact us  anytime with any questions, problems, or concerns.  It was a pleasure to meet you today!  Websites that have reliable patient information: 1. American Academy of Asthma, Allergy , and Immunology: www.aaaai.org 2. Food Allergy  Research and Education (FARE): foodallergy.org 3. Mothers of Asthmatics: http://www.asthmacommunitynetwork.org 4. American College of Allergy , Asthma, and Immunology: www.acaai.org      "Like" us  on Facebook and Instagram for our latest updates!      A healthy democracy works best when [employer] participate! Make sure you are registered to vote! If you have moved or changed any of your contact information, you will need to get this updated before voting! Scan the QR codes below to learn more!        Airborne Adult Perc - 11/25/23 1014     Time Antigen Placed 1014    Allergen Manufacturer Jestine    Location Back    Number of Test 55    1. Control-Buffer 50% Glycerol Negative    2. Control-Histamine 2+    3. Bahia 3+    4. [place] 3+    5. Johnson 3+    6. [place]  Blue 3+    7. Meadow Fescue 3+    8. Perennial Rye 3+    9. Timothy 3+    10. Ragweed Mix 3+    11. Cocklebur 2+    12. Plantain,  English 2+    13. Baccharis Negative    14.  Dog Fennel 2+    15. Guernsey Thistle 2+    16. Lamb's Quarters 2+    17. Sheep Sorrell 2+    18. Rough Pigweed 2+    19. Marsh Elder, Rough 2+    20. Mugwort, Common 2+    21. Box, Elder 4+    22. Cedar, red 4+    23. Sweet Gum 4+    24. Pecan Pollen 4+    25. Pine Mix 2+    26. Walnut, Black Pollen 4+    27. Red Mulberry 2+    28. Ash Mix 2+    29. Birch Mix 4+    30. Beech American 4+    31. Cottonwood, [place] 2+    32. Hickory, White 4+    33. Maple Mix 3+    34. Oak, [place] Mix 4+    35. Sycamore Eastern Negative    36. Alternaria Alternata 2+    37.  Cladosporium Herbarum 3+    38. Aspergillus Mix 3+    39. Penicillium Mix Negative    40. Bipolaris Sorokiniana (Helminthosporium) 3+    41. Drechslera Spicifera (Curvularia) 3+    42. Mucor Plumbeus 3+    43. Fusarium Moniliforme 4+    44. Aureobasidium Pullulans (pullulara) 3+    45. Rhizopus Oryzae 2+    46. Botrytis Cinera Negative    47. Epicoccum Nigrum 3+    48. Phoma Betae Negative    49. Dust Mite Mix 2+    50. Cat Hair 10,000 BAU/ml 3+    51.  Dog Epithelia Negative    52. Mixed Feathers Negative    53. Horse Epithelia Negative    54. Cockroach, German Negative    55. Tobacco Leaf Negative          Reducing Pollen Exposure  The American Academy of Allergy , Asthma and Immunology suggests the following steps to reduce your exposure to pollen during allergy  seasons.    Do not hang sheets or clothing out to dry; pollen may collect on these items. Do not mow lawns or spend time around freshly cut grass; mowing stirs up pollen. Keep windows closed at night.  Keep car windows closed while driving. Minimize morning activities outdoors, a time when pollen counts are usually at their highest. Stay indoors as much as possible when pollen counts or humidity is high and on windy days when pollen tends to remain in the air longer. Use air conditioning when possible.  Many air conditioners have filters that trap the pollen spores. Use a HEPA room air filter to remove pollen form the indoor air you breathe.  Control of Mold Allergen   Mold and fungi can grow on a variety of surfaces provided certain temperature and moisture conditions exist.  Outdoor molds grow on plants, decaying vegetation and soil.  The major outdoor mold, Alternaria and Cladosporium, are found in very high numbers during hot and dry conditions.  Generally, a late Summer - Fall peak is seen for common outdoor fungal spores.  Rain will temporarily lower outdoor mold spore count, but counts rise rapidly when the  rainy period ends.  The most important indoor molds are Aspergillus and Penicillium.  Dark, humid and poorly ventilated basements are ideal sites for mold growth.  The next most common sites of mold growth are the bathroom and the kitchen.  Outdoor (Seasonal) Mold Control   Use air conditioning and keep windows closed Avoid exposure to decaying vegetation. Avoid leaf raking. Avoid grain  handling. Consider wearing a face mask if working in moldy areas.    Indoor (Perennial) Mold Control    Maintain humidity below 50%. Clean washable surfaces with 5% bleach solution. Remove sources e.g. contaminated carpets.    Control of Dog or Cat Allergen  Avoidance is the best way to manage a dog or cat allergy . If you have a dog or cat and are allergic to dog or cats, consider removing the dog or cat from the home. If you have a dog or cat but don't want to find it a new home, or if your family wants a pet even though someone in the household is allergic, here are some strategies that may help keep symptoms at bay:  Keep the pet out of your bedroom and restrict it to only a few rooms. Be advised that keeping the dog or cat in only one room will not limit the allergens to that room. Don't pet, hug or kiss the dog or cat; if you do, wash your hands with soap and water. High-efficiency particulate air (HEPA) cleaners run continuously in a bedroom or living room can reduce allergen levels over time. Regular use of a high-efficiency vacuum cleaner or a central vacuum can reduce allergen levels. Giving your dog or cat a bath at least once a week can reduce airborne allergen.  Control of Dust Mite Allergen    Dust mites play a major role in allergic asthma and rhinitis.  They occur in environments with high humidity wherever human skin is found.  Dust mites absorb humidity from the atmosphere (ie, they do not drink) and feed on organic matter (including shed human and animal skin).  Dust mites are a  microscopic type of insect that you cannot see with the naked eye.  High levels of dust mites have been detected from mattresses, pillows, carpets, upholstered furniture, bed covers, clothes, soft toys and any woven material.  The principal allergen of the dust mite is found in its feces.  A gram of dust may contain 1,000 mites and 250,000 fecal particles.  Mite antigen is easily measured in the air during house cleaning activities.  Dust mites do not bite and do not cause harm to humans, other than by triggering allergies/asthma.    Ways to decrease your exposure to dust mites in your home:  Encase mattresses, box springs and pillows with a mite-impermeable barrier or cover   Wash sheets, blankets and drapes weekly in hot water (130 F) with detergent and dry them in a dryer on the hot setting.  Have the room cleaned frequently with a vacuum cleaner and a damp dust-mop.  For carpeting or rugs, vacuuming with a vacuum cleaner equipped with a high-efficiency particulate air (HEPA) filter.  The dust mite allergic individual should not be in a room which is being cleaned and should wait 1 hour after cleaning before going into the room. Do not sleep on upholstered furniture (eg, couches).   If possible removing carpeting, upholstered furniture and drapery from the home is ideal.  Horizontal blinds should be eliminated in the rooms where the person spends the most time (bedroom, study, television room).  Washable vinyl, roller-type shades are optimal. Remove all non-washable stuffed toys from the bedroom.  Wash stuffed toys weekly like sheets and blankets above.   Reduce indoor humidity to less than 50%.  Inexpensive humidity monitors can be purchased at most hardware stores.  Do not use a humidifier as can make the problem worse and are not  recommended.

## 2023-11-25 NOTE — Progress Notes (Signed)
 FOLLOW UP  Date of Service/Encounter:  11/25/23   Assessment:   Insect sting allergy    Perennial and seasonal allergic rhinitis (grasses, ragweed, weeds, trees, indoor molds, outdoor molds, dust mites, and cat)   Childhood asthma - last inhaler use in high school  Plan/Recommendations:   1. Insect sting allergy  - Testing was positive to fire  ants and some stinging insects. - But they were LOW and I think that skin venom testing would clarify this more. - Let's get you scheduled for December Justin Cantu from [facility] will call you to schedule that). - In the meantime, keep your EpiPen  handy.   2. Seasonal allergic rhinitis - Testing today showed: grasses, ragweed, weeds, trees, indoor molds, outdoor molds, dust mites, and cat - Copy of test results provided.  - Avoidance measures provided. - Start taking: Xyzal  (levocetirizine) 5mg  tablet once daily - You can use an extra dose of the antihistamine, if needed, for breakthrough symptoms.  - Consider nasal saline rinses 1-2 times daily to remove allergens from the nasal cavities as well as help with mucous clearance (this is especially helpful to do before the nasal sprays are given) - Consider allergy  shots as a means of long-term control. - Allergy  shots re-train and reset the immune system to ignore environmental allergens and decrease the resulting immune response to those allergens (sneezing, itchy watery eyes, runny nose, nasal congestion, etc).    - Allergy  shots improve symptoms in 75-85% of patients.  - We can discuss more at the next appointment if the medications are not working for you. - But your environmental allergens are likely the least of your concerns at this point!   3. Large local reactions to mosquito bites - Continue with triamcinolone  0.1% ointment twice daily for a few days to keep the inflammation under control. - This will decrease the scratching and prevent infections from occurring.   4. Return in  about 3 months (around 02/24/2024). You can have the follow up appointment with Dr. Iva or a Nurse Practicioner (our Nurse Practitioners are excellent and always have Physician oversight!).    Subjective:   Justin Cantu is a 53 y.o. male presenting today for follow up of No chief complaint on file.   Justin Cantu has a history of the following: Patient Active Problem List   Diagnosis Date Noted   Current moderate episode of major depressive disorder (HCC) 10/21/2023   Type 2 diabetes mellitus with hyperglycemia, without long-term current use of insulin (HCC) 10/21/2023   Class 1 obesity due to excess calories with serious comorbidity and body mass index (BMI) of 32.0 to 32.9 in adult 10/21/2023   Erectile dysfunction 09/16/2023   Need for vaccination with 20-polyvalent pneumococcal conjugate vaccine 04/29/2023   Essential hypertension 12/07/2022   Generalized anxiety disorder 12/07/2022   Diabetes mellitus due to underlying condition with unspecified complications (HCC) 08/30/2017    History obtained from: chart review and patient.  Discussed the use of AI scribe software for clinical note transcription with the patient and/or guardian, who gave verbal consent to proceed.  Justin Cantu is a 53 y.o. male presenting for skin testing. He was last seen on September 4th. We could not do testing because his insurance company does not cover testing on the same day as a New Patient visit. He has been off of all antihistamines 3 days in anticipation of the testing.   At that visit, we got labs to look at his stinging insect allergies.  We also decided to do  environmental allergy  testing.  We added on triamcinolone  to treat his large local reactions to mosquito bites.  He had tried Zyrtec and Benadryl with minimal improvement.  He stinging insect labs came back slightly elevated to fire  ant as well as a few of the stinging insects.  All of his IgE levels were fairly low.  Otherwise, there have  been no changes to his past medical history, surgical history, family history, or social history.    Review of systems otherwise negative other than that mentioned in the HPI.    Objective:   There were no vitals taken for this visit. There is no height or weight on file to calculate BMI.    Physical exam deferred since this was a skin testing appointment only.   Diagnostic studies:   Allergy  Studies:     Airborne Adult Perc - 11/25/23 1014     Time Antigen Placed 1014    Allergen Manufacturer Jestine    Location Back    Number of Test 55    1. Control-Buffer 50% Glycerol Negative    2. Control-Histamine 2+    3. Bahia 3+    4. [place] 3+    5. Johnson 3+    6. [place]  Blue 3+    7. Meadow Fescue 3+    8. Perennial Rye 3+    9. Timothy 3+    10. Ragweed Mix 3+    11. Cocklebur 2+    12. Plantain,  English 2+    13. Baccharis Negative    14. Dog Fennel 2+    15. Guernsey Thistle 2+    16. Lamb's Quarters 2+    17. Sheep Sorrell 2+    18. Rough Pigweed 2+    19. Marsh Elder, Rough 2+    20. Mugwort, Common 2+    21. Box, Elder 4+    22. Cedar, red 4+    23. Sweet Gum 4+    24. Pecan Pollen 4+    25. Pine Mix 2+    26. Walnut, Black Pollen 4+    27. Red Mulberry 2+    28. Ash Mix 2+    29. Birch Mix 4+    30. Beech American 4+    31. Cottonwood, [place] 2+    32. Hickory, White 4+    33. Maple Mix 3+    34. Oak, [place] Mix 4+    35. Sycamore Eastern Negative    36. Alternaria Alternata 2+    37. Cladosporium Herbarum 3+    38. Aspergillus Mix 3+    39. Penicillium Mix Negative    40. Bipolaris Sorokiniana (Helminthosporium) 3+    41. Drechslera Spicifera (Curvularia) 3+    42. Mucor Plumbeus 3+    43. Fusarium Moniliforme 4+    44. Aureobasidium Pullulans (pullulara) 3+    45. Rhizopus Oryzae 2+    46. Botrytis Cinera Negative    47. Epicoccum Nigrum 3+    48. Phoma Betae Negative    49. Dust Mite Mix 2+    50. Cat Hair 10,000 BAU/ml 3+    51.   Dog Epithelia Negative    52. Mixed Feathers Negative    53. Horse Epithelia Negative    54. Cockroach, German Negative    55. Tobacco Leaf Negative          Allergy  testing results were read and interpreted by myself, documented by clinical staff.      Marty Shaggy, MD  [facility] 

## 2023-11-30 ENCOUNTER — Encounter: Payer: Self-pay | Admitting: Urology

## 2023-11-30 ENCOUNTER — Ambulatory Visit (INDEPENDENT_AMBULATORY_CARE_PROVIDER_SITE_OTHER): Admitting: Urology

## 2023-11-30 VITALS — BP 143/83 | HR 76 | Ht 70.0 in | Wt 215.0 lb

## 2023-11-30 DIAGNOSIS — N5089 Other specified disorders of the male genital organs: Secondary | ICD-10-CM | POA: Diagnosis not present

## 2023-11-30 DIAGNOSIS — N5201 Erectile dysfunction due to arterial insufficiency: Secondary | ICD-10-CM | POA: Diagnosis not present

## 2023-11-30 NOTE — Progress Notes (Signed)
 11/30/2023 7:52 AM   Ervin Lorretta 15-May-1970 [id number]  Referring provider: Leonarda Roxan BROCKS, NP [address] [place],  [place] [address]  Chief Complaint  Patient presents with   Other    HPI: Timarion Agcaoili is a 53 y.o. male presents for a follow-up visit.  Refer to my prior note 10/19/2023.  The scrotal mass was appreciated on the left side and was consistent with postvasectomy epididymal changes. Scrotal sonogram performed 10/28/2023 showed 2 nonvascular masses posterior to the right testis each measuring ~1 cm Mild improvement in the ED on titrating tadalafil  to 40 mg Testosterone  level is 334 ng/dL; PSA was 0.7   PMH: Past Medical History:  Diagnosis Date   Anxiety    Depression    Diabetes mellitus without complication (HCC)    Headache    Herpes simplex     Surgical History: Past Surgical History:  Procedure Laterality Date   NO PAST SURGERIES      Home Medications:  Allergies as of 11/30/2023       Reactions   Bee Venom Anaphylaxis   And fire  ants        Medication List        Accurate as of November 30, 2023  7:52 AM. If you have any questions, ask your nurse or doctor.          ALPRAZolam  0.5 MG tablet Commonly known as: XANAX  TAKE 1 TABLET BY MOUTH 2 TIMES DAILY AS NEEDED FOR ANXIETY.   amLODipine  10 MG tablet Commonly known as: NORVASC  Take 1 tablet (10 mg total) by mouth daily.   aspirin  EC 81 MG tablet Take 1 tablet (81 mg total) by mouth daily.   buPROPion  300 MG 24 hr tablet Commonly known as: WELLBUTRIN  XL TAKE 1 TABLET BY MOUTH EVERY DAY   EPINEPHrine  0.3 mg/0.3 mL Soaj injection Commonly known as: EPI-PEN Inject 0.3 mg into the muscle See admin instructions.   FLUoxetine 20 MG capsule Commonly known as: PROZAC Take 20 mg by mouth daily.   glipiZIDE  10 MG 24 hr tablet Commonly known as: GLUCOTROL  XL TAKE 1 TABLET BY MOUTH EVERY DAY   hydrochlorothiazide  25 MG tablet Commonly known as: HYDRODIURIL  Take 1 tablet (25  mg total) by mouth daily.   levocetirizine 5 MG tablet Commonly known as: XYZAL  Take 1 tablet (5 mg total) by mouth every evening.   losartan  100 MG tablet Commonly known as: COZAAR  TAKE 1 TABLET BY MOUTH EVERY DAY   MULTIVITAMIN PO Take 1 tablet by mouth daily.   Rybelsus  14 MG Tabs Generic drug: Semaglutide  TAKE 1 TABLET (14 MG TOTAL) BY MOUTH DAILY   tadalafil  20 MG tablet Commonly known as: CIALIS  Take one tab  by mouth one hour prior to intercourse   triamcinolone  ointment 0.1 % Commonly known as: KENALOG  Apply 1 Application topically 2 (two) times daily.   vardenafil  20 MG tablet Commonly known as: Levitra  Take 1 tablet (20 mg total) by mouth as needed for erectile dysfunction.        Allergies:  Allergies  Allergen Reactions   Bee Venom Anaphylaxis    And fire  ants    Family History: Family History  Problem Relation Age of Onset   Hypertension Mother    Diabetes Mother    Heart disease Mother    Hypertension Father     Social History:  reports that he has never smoked. He has never used smokeless tobacco. He reports current alcohol use. He reports that he does  not use drugs.   Physical Exam: BP (!) 143/83   Pulse 76   Ht 5' 10 (1.778 m)   Wt 215 lb (97.5 kg)   BMI 30.85 kg/m   Constitutional:  Alert, No acute distress. HEENT: [place] AT Respiratory: Normal respiratory effort, no increased work of breathing. GU: The nonvascular masses noted on ultrasound are palpable and appeared to arise from the epididymis Psychiatric: Normal mood and affect.    Assessment & Plan:    1.  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  No treatment recommended Recommend monthly self exam and to return for any increased size or development of pain Follow-up visit 6 months for recheck  2.  Erectile dysfunction Mild improvement on tadalafil  titration to 40 mg He inquired about TRT.  We discussed AUA guidelines do not  recommend treatment for total testosterone  levels 300 or greater.  We discussed resistance training can increase negative testosterone  levels   Glendia JAYSON Barba, MD  [facility] [address] [place], [place] [address] [phone]

## 2023-12-07 ENCOUNTER — Encounter: Payer: Self-pay | Admitting: Urology

## 2023-12-30 ENCOUNTER — Other Ambulatory Visit: Payer: Self-pay | Admitting: Family

## 2023-12-30 DIAGNOSIS — F321 Major depressive disorder, single episode, moderate: Secondary | ICD-10-CM

## 2023-12-30 DIAGNOSIS — I10 Essential (primary) hypertension: Secondary | ICD-10-CM

## 2024-01-13 ENCOUNTER — Other Ambulatory Visit: Payer: Self-pay | Admitting: Family

## 2024-01-13 DIAGNOSIS — E088 Diabetes mellitus due to underlying condition with unspecified complications: Secondary | ICD-10-CM

## 2024-01-25 ENCOUNTER — Other Ambulatory Visit: Payer: Self-pay | Admitting: Urology

## 2024-01-25 ENCOUNTER — Other Ambulatory Visit: Payer: Self-pay | Admitting: Nurse Practitioner

## 2024-01-25 DIAGNOSIS — N5201 Erectile dysfunction due to arterial insufficiency: Secondary | ICD-10-CM

## 2024-01-26 NOTE — Telephone Encounter (Signed)
 Patient has request for refill on 01/26/2024. Patient last refill was 09/16/23. Patient has contract on file dated 11/13/22. Patient has upcoming appointment 04/20/24. Medication pend and sent to PCP (Ngetich, Dinah C, NP) for approval.

## 2024-02-22 ENCOUNTER — Encounter: Payer: Self-pay | Admitting: Allergy & Immunology

## 2024-02-22 ENCOUNTER — Other Ambulatory Visit: Payer: Self-pay

## 2024-02-22 ENCOUNTER — Ambulatory Visit: Admitting: Allergy & Immunology

## 2024-02-22 VITALS — BP 116/78 | HR 72 | Temp 97.5°F | Resp 18

## 2024-02-22 DIAGNOSIS — J302 Other seasonal allergic rhinitis: Secondary | ICD-10-CM | POA: Diagnosis not present

## 2024-02-22 DIAGNOSIS — T63481D Toxic effect of venom of other arthropod, accidental (unintentional), subsequent encounter: Secondary | ICD-10-CM

## 2024-02-22 DIAGNOSIS — J3089 Other allergic rhinitis: Secondary | ICD-10-CM | POA: Diagnosis not present

## 2024-02-22 MED ORDER — EPINEPHRINE 0.3 MG/0.3ML IJ SOAJ: 0.3000 mg | INTRAMUSCULAR | 2 refills | Status: AC

## 2024-02-22 NOTE — Progress Notes (Signed)
 FOLLOW UP  Date of Service/Encounter:  02/22/2024   Assessment:   Insect sting allergy    Perennial and seasonal allergic rhinitis (grasses, ragweed, weeds, trees, indoor molds, outdoor molds, dust mites, and cat)   Childhood asthma - last inhaler use in high school  Plan/Recommendations:   1. Insect sting allergy  (fire  ants and stinging insects)  - We are not going to do skin testing since you are not going to do venom immunotherapy anyway.  - In the meantime, keep your EpiPen  handy.   2. Seasonal and perennial allergic rhinitis - well controlled at this time - Testing in the past showed: grasses, ragweed, weeds, trees, indoor molds, outdoor molds, dust mites, and cat - Continue taking: Xyzal  (levocetirizine) 5mg  tablet once daily - You can use an extra dose of the antihistamine, if needed, for breakthrough symptoms.  - Consider nasal saline rinses 1-2 times daily to remove allergens from the nasal cavities as well as help with mucous clearance (this is especially helpful to do before the nasal sprays are given) - I do not think that allergy  shots are needed at this point in time.   3. Large local reactions to mosquito bites - Continue with triamcinolone  0.1% ointment twice daily for a few days to keep the inflammation under control.  4. Return in about 1 year (around 02/21/2025). You can have the follow up appointment with Dr. Iva or a Nurse Practicioner (our Nurse Practitioners are excellent and always have Physician oversight!).   Subjective:   Justin Cantu is a 53 y.o. male presenting today for follow up of  Chief Complaint  Patient presents with   Follow-up   Immunotherapy    venom    Justin Cantu has a history of the following: Patient Active Problem List   Diagnosis Date Noted   Current moderate episode of major depressive disorder (HCC) 10/21/2023   Type 2 diabetes mellitus with hyperglycemia, without long-term current use of insulin (HCC) 10/21/2023    Class 1 obesity due to excess calories with serious comorbidity and body mass index (BMI) of 32.0 to 32.9 in adult 10/21/2023   Erectile dysfunction 09/16/2023   Need for vaccination with 20-polyvalent pneumococcal conjugate vaccine 04/29/2023   Essential hypertension 12/07/2022   Generalized anxiety disorder 12/07/2022   Diabetes mellitus due to underlying condition with unspecified complications (HCC) 08/30/2017    History obtained from: chart review and patient.  Discussed the use of AI scribe software for clinical note transcription with the patient and/or guardian, who gave verbal consent to proceed.  Justin Cantu is a 53 y.o. male presenting for a follow up visit.  He was last seen in September 2025.  At that time, he had testing that was low to fire  ants and stinging insects.  We talked about doing skin testing at some point if he was interested in venom immunotherapy.  For his allergic rhinitis, we continued with Xyzal  daily.  Allergy  shots were discussed for long-term control.  He continue with triamcinolone  as needed for large local reactions for mosquito bites.  Since last visit, he has done well.  He reports an allergy  to insect venom, specifically fire  ants, and states that his symptoms began after he was stung while in the [employer]. Despite the passage of thirty years since the initial exposure, he reports that he remains allergic to insect stings based on testing and history. He avoids insect bites and carries an EpiPen , which may need updating. 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  He experiences environmental allergies, which manifest as itchy, watery eyes and a runny nose. He manages these symptoms by avoiding allergens and occasionally using medication as needed. He does not take Xyzal  daily but has it available for use when necessary.  He mentions ongoing issues  with the [employer] regarding his disability claim related to the insect bite, noting a lack of medical records from the incident. He travels to [place]  to meet his daughter, who lives in [place] , for holidays and summer visits.  No recent sinus or ear infections, eczema, or hives.   Otherwise, there have been no changes to his past medical history, surgical history, family history, or social history.    Review of systems otherwise negative other than that mentioned in the HPI.    Objective:   Blood pressure 116/78, pulse 72, temperature (!) 97.5 F (36.4 C), temperature source Temporal, resp. rate 18, SpO2 100%. There is no height or weight on file to calculate BMI.    Physical Exam Vitals reviewed.  Constitutional:      Appearance: He is well-developed.     Comments: Pleasant male.  HENT:     Head: Normocephalic and atraumatic.     Right Ear: Tympanic membrane, ear canal and external ear normal. No drainage, swelling or tenderness. Tympanic membrane is not injected, scarred, erythematous, retracted or bulging.     Left Ear: Tympanic membrane, ear canal and external ear normal. No drainage, swelling or tenderness. Tympanic membrane is not injected, scarred, erythematous, retracted or bulging.     Nose: No nasal deformity, septal deviation, mucosal edema or rhinorrhea.     Right Turbinates: Enlarged, swollen and pale.     Left Turbinates: Enlarged, swollen and pale.     Right Sinus: No maxillary sinus tenderness or frontal sinus tenderness.     Left Sinus: No maxillary sinus tenderness or frontal sinus tenderness.     Comments: No polyps noted.    Mouth/Throat:     Lips: Pink.     Mouth: Mucous membranes are moist. Mucous membranes are not pale and not dry.     Pharynx: Uvula midline.  Eyes:     General: Lids are normal. Allergic shiner present.        Right eye: No discharge.        Left eye: No discharge.     Conjunctiva/sclera: Conjunctivae normal.     Right eye: Right  conjunctiva is not injected. No chemosis.    Left eye: Left conjunctiva is not injected. No chemosis.    Pupils: Pupils are equal, round, and reactive to light.  Cardiovascular:     Rate and Rhythm: Normal rate and regular rhythm.     Heart sounds: Normal heart sounds.  Pulmonary:     Effort: Pulmonary effort is normal. No tachypnea, accessory muscle usage or respiratory distress.     Breath sounds: Normal breath sounds. No wheezing, rhonchi or rales.     Comments: Moving air well in all lung fields. Chest:     Chest wall: No tenderness.  Abdominal:     Tenderness: There is no abdominal tenderness. There is no guarding or rebound.  Lymphadenopathy:     Head:     Right side of head: No submandibular, tonsillar or occipital adenopathy.     Left side of head: No submandibular, tonsillar or occipital adenopathy.     Cervical: Cervical adenopathy present.  Skin:    Coloration: Skin is not pale.     Findings: No abrasion, erythema, petechiae or rash. Rash is not papular, urticarial or vesicular.  Neurological:     Mental Status: He is alert.  Psychiatric:        Behavior: Behavior is cooperative.      Diagnostic studies: none      Marty Shaggy, MD  [facility] 

## 2024-02-22 NOTE — Patient Instructions (Addendum)
 1. Insect sting allergy  (fire  ants and stinging insects)  - We are not going to do skin testing since you are not going to do venom immunotherapy anyway.  - In the meantime, keep your EpiPen  handy.   2. Seasonal and perennial allergic rhinitis - well controlled at this time - Testing in the past showed: grasses, ragweed, weeds, trees, indoor molds, outdoor molds, dust mites, and cat - Continue taking: Xyzal  (levocetirizine) 5mg  tablet once daily - You can use an extra dose of the antihistamine, if needed, for breakthrough symptoms.  - Consider nasal saline rinses 1-2 times daily to remove allergens from the nasal cavities as well as help with mucous clearance (this is especially helpful to do before the nasal sprays are given) - I do not think that allergy  shots are needed at this point in time.   3. Large local reactions to mosquito bites - Continue with triamcinolone  0.1% ointment twice daily for a few days to keep the inflammation under control.  4. Return in about 1 year (around 02/21/2025). You can have the follow up appointment with Dr. Iva or a Nurse Practicioner (our Nurse Practitioners are excellent and always have Physician oversight!).    Please inform us  of any Emergency Department visits, hospitalizations, or changes in symptoms. Call us  before going to the ED for breathing or allergy  symptoms since we might be able to fit you in for a sick visit. Feel free to contact us  anytime with any questions, problems, or concerns.  It was a pleasure to see you again today!  Websites that have reliable patient information: 1. American Academy of Asthma, Allergy , and Immunology: www.aaaai.org 2. Food Allergy  Research and Education (FARE): foodallergy.org 3. Mothers of Asthmatics: http://www.asthmacommunitynetwork.org 4. American College of Allergy , Asthma, and Immunology: www.acaai.org      Like us  on [employer] and Instagram for our latest updates!      A healthy democracy  works best when [employer] participate! Make sure you are registered to vote! If you have moved or changed any of your contact information, you will need to get this updated before voting! Scan the QR codes below to learn more!

## 2024-04-20 ENCOUNTER — Ambulatory Visit: Payer: Self-pay | Admitting: Family

## 2024-05-30 ENCOUNTER — Ambulatory Visit: Admitting: Urology

## 2025-02-22 ENCOUNTER — Ambulatory Visit: Admitting: Allergy & Immunology
